# Patient Record
Sex: Female | Born: 1980 | Race: Black or African American | Hispanic: No | Marital: Married | Smoking: Never smoker
Health system: Southern US, Community
[De-identification: ages and names within clinical notes are randomized; demographics above are authoritative.]

## PROBLEM LIST (undated history)

## (undated) ENCOUNTER — Inpatient Hospital Stay (HOSPITAL_COMMUNITY): Payer: Self-pay

## (undated) DIAGNOSIS — E785 Hyperlipidemia, unspecified: Secondary | ICD-10-CM

## (undated) DIAGNOSIS — E119 Type 2 diabetes mellitus without complications: Secondary | ICD-10-CM

## (undated) DIAGNOSIS — T8859XA Other complications of anesthesia, initial encounter: Secondary | ICD-10-CM

## (undated) DIAGNOSIS — O24419 Gestational diabetes mellitus in pregnancy, unspecified control: Secondary | ICD-10-CM

## (undated) DIAGNOSIS — T4145XA Adverse effect of unspecified anesthetic, initial encounter: Secondary | ICD-10-CM

## (undated) DIAGNOSIS — Z789 Other specified health status: Secondary | ICD-10-CM

## (undated) HISTORY — DX: Gestational diabetes mellitus in pregnancy, unspecified control: O24.419

## (undated) HISTORY — DX: Type 2 diabetes mellitus without complications: E11.9

## (undated) HISTORY — DX: Hyperlipidemia, unspecified: E78.5

---

## 2008-11-30 ENCOUNTER — Inpatient Hospital Stay (HOSPITAL_COMMUNITY): Admission: AD | Admit: 2008-11-30 | Discharge: 2008-11-30 | Payer: Self-pay | Admitting: Obstetrics & Gynecology

## 2009-01-26 ENCOUNTER — Ambulatory Visit (HOSPITAL_COMMUNITY): Admission: RE | Admit: 2009-01-26 | Discharge: 2009-01-26 | Payer: Self-pay | Admitting: Obstetrics & Gynecology

## 2009-04-29 ENCOUNTER — Encounter: Admission: RE | Admit: 2009-04-29 | Discharge: 2009-04-29 | Payer: Self-pay | Admitting: Obstetrics & Gynecology

## 2009-04-29 ENCOUNTER — Ambulatory Visit (HOSPITAL_COMMUNITY): Admission: RE | Admit: 2009-04-29 | Discharge: 2009-04-29 | Payer: Self-pay | Admitting: Obstetrics & Gynecology

## 2009-05-28 ENCOUNTER — Ambulatory Visit (HOSPITAL_COMMUNITY): Admission: RE | Admit: 2009-05-28 | Discharge: 2009-05-28 | Payer: Self-pay | Admitting: Obstetrics & Gynecology

## 2009-06-14 ENCOUNTER — Encounter: Payer: Self-pay | Admitting: Obstetrics

## 2009-06-14 ENCOUNTER — Inpatient Hospital Stay (HOSPITAL_COMMUNITY): Admission: AD | Admit: 2009-06-14 | Discharge: 2009-06-17 | Payer: Self-pay | Admitting: Obstetrics

## 2009-07-18 ENCOUNTER — Ambulatory Visit: Payer: Self-pay | Admitting: Obstetrics and Gynecology

## 2009-07-18 ENCOUNTER — Inpatient Hospital Stay (HOSPITAL_COMMUNITY): Admission: AD | Admit: 2009-07-18 | Discharge: 2009-07-18 | Payer: Self-pay | Admitting: Obstetrics & Gynecology

## 2010-02-09 IMAGING — US US OB DETAIL+14 WK
1 series · 14 of 28 positions shown · non-contrast
Comparison: none

OBSTETRICAL ULTRASOUND:
 This ultrasound exam was performed in the [HOSPITAL] Ultrasound Department.  The OB US report was generated in the AS system, and faxed to the ordering physician.  This report is also available in [HOSPITAL]?s accessANYware and in [REDACTED] PACS.

[Series 1: us ob detail +14 wk · 14 of 71 slices shown]
[im 3/71]
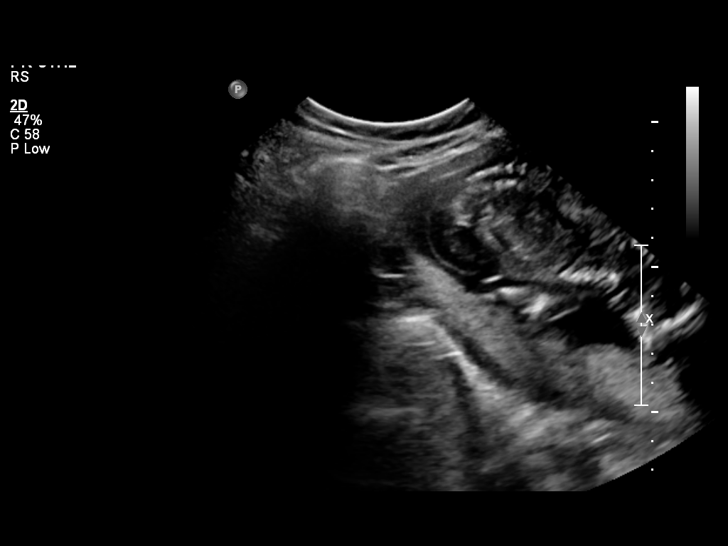
[im 8/71]
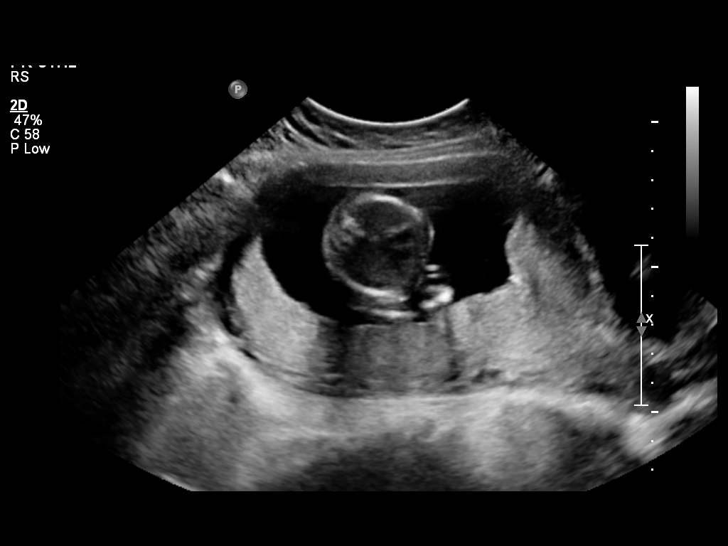
[im 13/71]
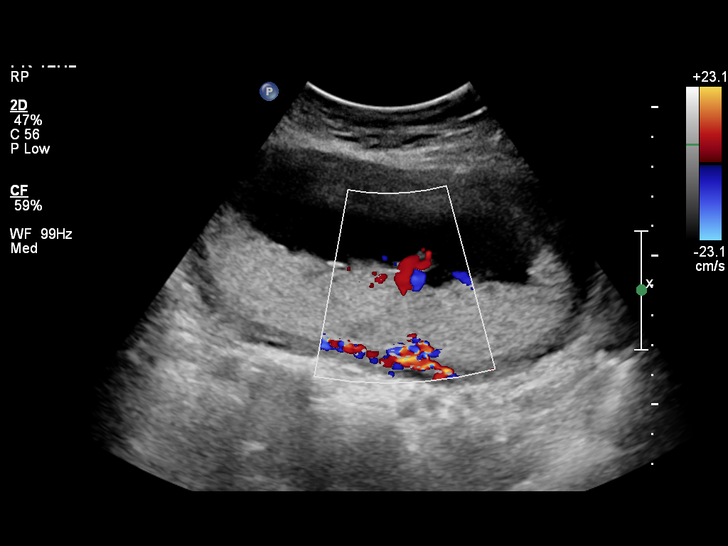
[im 19/71]
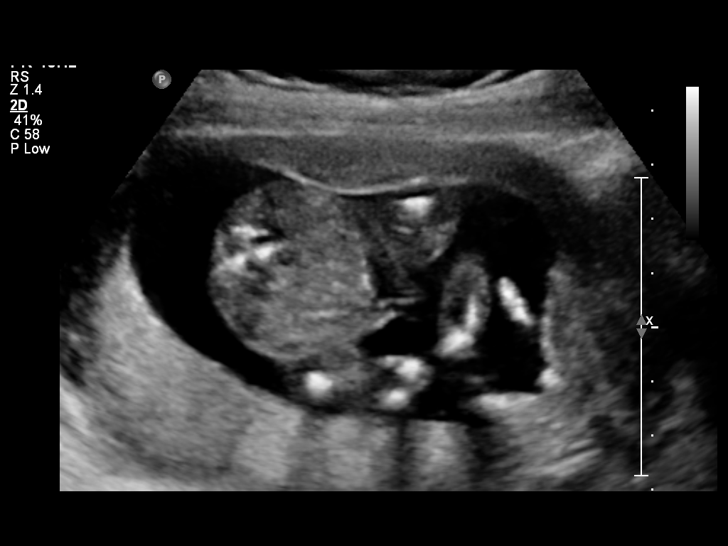
[im 24/71]
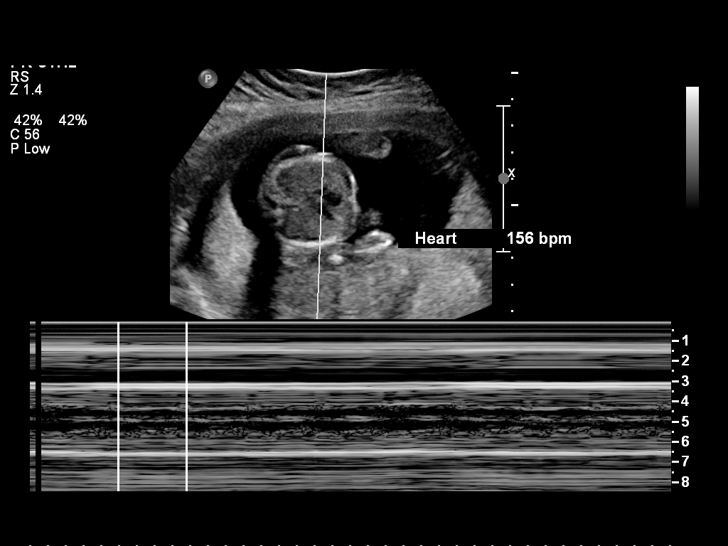
[im 29/71]
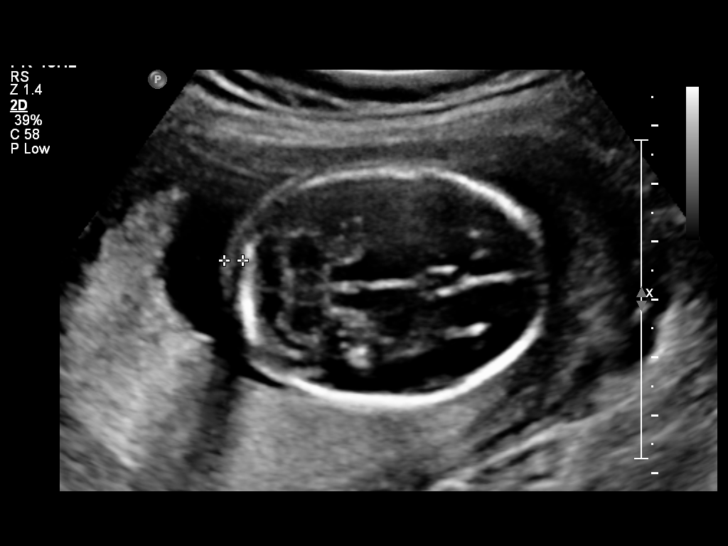
[im 34/71]
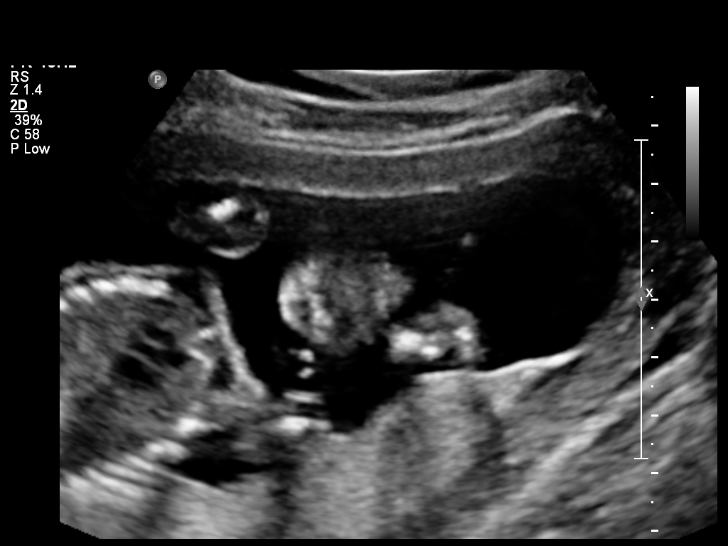
[im 39/71]
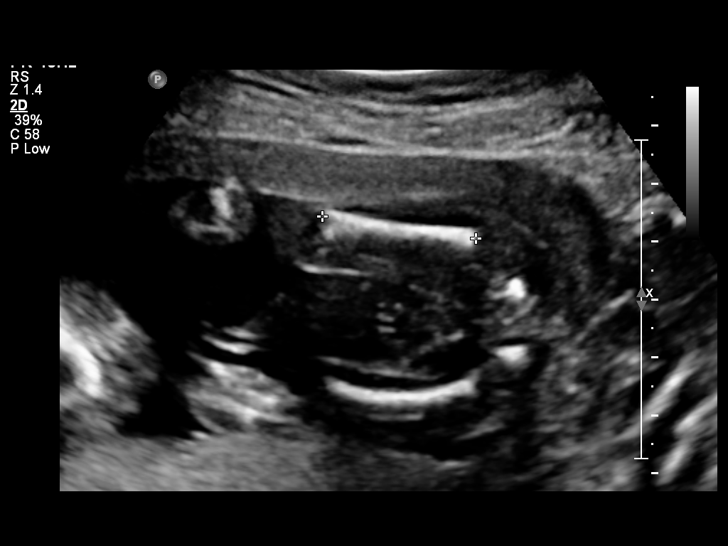
[im 45/71]
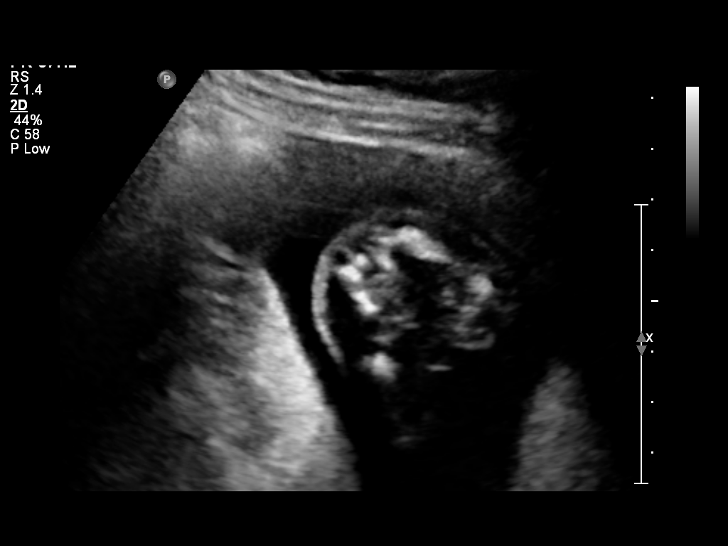
[im 50/71]
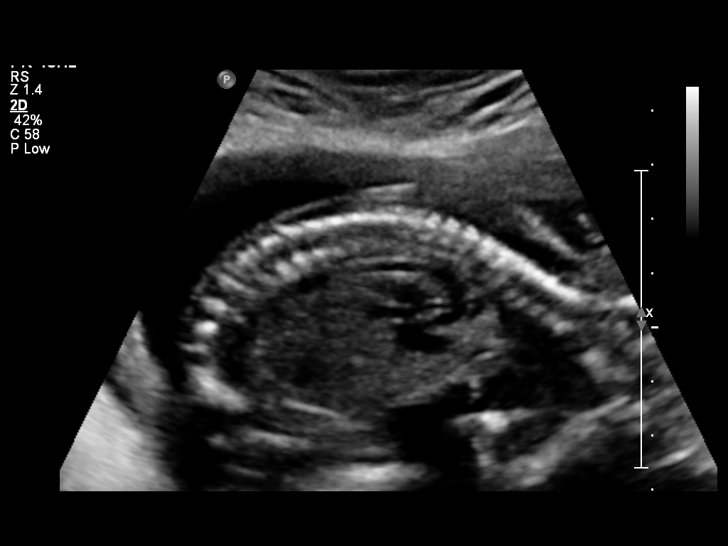
[im 55/71]
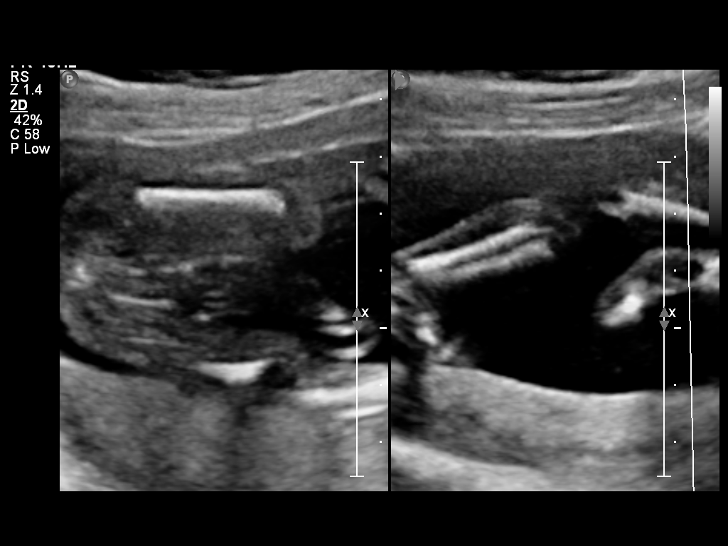
[im 60/71]
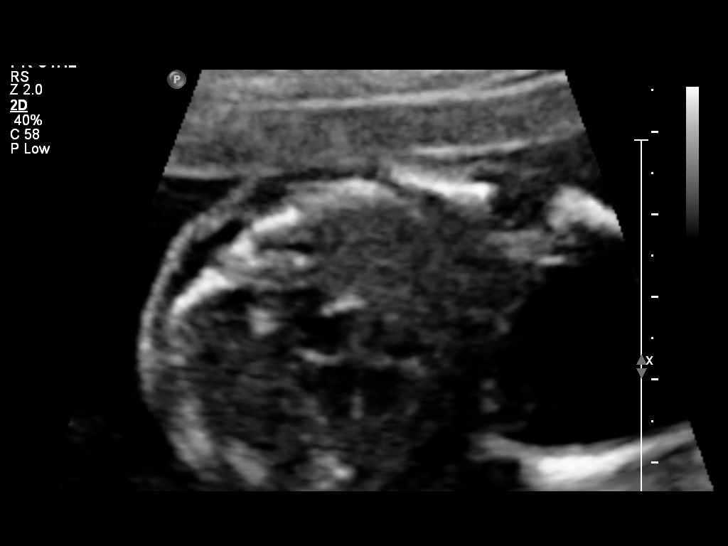
[im 65/71]
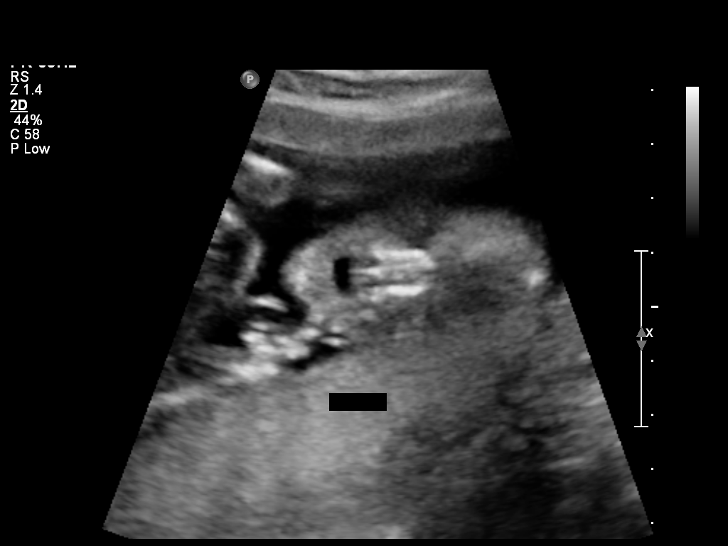
[im 71/71]
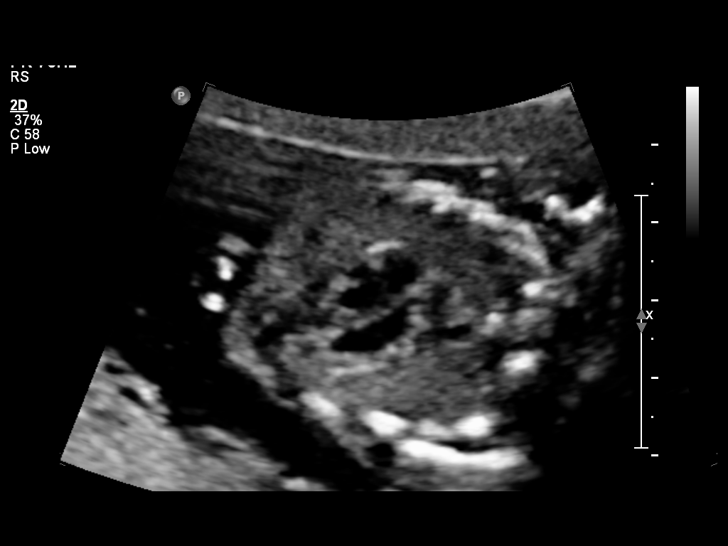

[14 of 28 positions shown; findings below may reference images not displayed]

IMPRESSION: See AS Obstetric US report.

## 2010-05-15 ENCOUNTER — Encounter: Payer: Self-pay | Admitting: Obstetrics & Gynecology

## 2010-07-13 LAB — CBC
Hemoglobin: 12.9 g/dL (ref 12.0–15.0)
MCHC: 33.9 g/dL (ref 30.0–36.0)
RBC: 4.35 MIL/uL (ref 3.87–5.11)
WBC: 7 10*3/uL (ref 4.0–10.5)

## 2010-07-13 LAB — GLUCOSE, CAPILLARY
Glucose-Capillary: 146 mg/dL — ABNORMAL HIGH (ref 70–99)
Glucose-Capillary: 85 mg/dL (ref 70–99)

## 2010-07-31 LAB — URINALYSIS, ROUTINE W REFLEX MICROSCOPIC
Bilirubin Urine: NEGATIVE
Glucose, UA: NEGATIVE mg/dL
Specific Gravity, Urine: 1.02 (ref 1.005–1.030)
pH: 7 (ref 5.0–8.0)

## 2010-07-31 LAB — URINE CULTURE: Culture: NO GROWTH

## 2010-07-31 LAB — WET PREP, GENITAL: Trich, Wet Prep: NONE SEEN

## 2010-07-31 LAB — GC/CHLAMYDIA PROBE AMP, URINE: Chlamydia, Swab/Urine, PCR: NEGATIVE

## 2013-03-14 ENCOUNTER — Encounter (HOSPITAL_COMMUNITY): Payer: Self-pay | Admitting: *Deleted

## 2013-03-14 ENCOUNTER — Inpatient Hospital Stay (HOSPITAL_COMMUNITY): Payer: No Typology Code available for payment source

## 2013-03-14 ENCOUNTER — Inpatient Hospital Stay (HOSPITAL_COMMUNITY)
Admission: AD | Admit: 2013-03-14 | Discharge: 2013-03-14 | Disposition: A | Discharge status: Home / Self Care | Payer: No Typology Code available for payment source | Source: Ambulatory Visit | Attending: Obstetrics & Gynecology | Admitting: Obstetrics & Gynecology

## 2013-03-14 DIAGNOSIS — O209 Hemorrhage in early pregnancy, unspecified: Secondary | ICD-10-CM | POA: Insufficient documentation

## 2013-03-14 DIAGNOSIS — R109 Unspecified abdominal pain: Secondary | ICD-10-CM | POA: Insufficient documentation

## 2013-03-14 HISTORY — DX: Other complications of anesthesia, initial encounter: T88.59XA

## 2013-03-14 HISTORY — DX: Other specified health status: Z78.9

## 2013-03-14 HISTORY — DX: Adverse effect of unspecified anesthetic, initial encounter: T41.45XA

## 2013-03-14 LAB — HCG, QUANTITATIVE, PREGNANCY: hCG, Beta Chain, Quant, S: 16875 m[IU]/mL — ABNORMAL HIGH (ref <5)

## 2013-03-14 LAB — URINALYSIS, ROUTINE W REFLEX MICROSCOPIC
Bilirubin Urine: NEGATIVE
Ketones, ur: NEGATIVE mg/dL
pH: 5 (ref 5.0–8.0)

## 2013-03-14 LAB — CBC
HCT: 38.1 % (ref 36.0–46.0)
MCV: 82.6 fL (ref 78.0–100.0)
RBC: 4.61 MIL/uL (ref 3.87–5.11)
WBC: 6.9 10*3/uL (ref 4.0–10.5)

## 2013-03-14 LAB — URINE MICROSCOPIC-ADD ON

## 2013-03-14 LAB — WET PREP, GENITAL
WBC, Wet Prep HPF POC: NONE SEEN
Yeast Wet Prep HPF POC: NONE SEEN

## 2013-03-14 LAB — POCT PREGNANCY, URINE: Preg Test, Ur: POSITIVE — AB

## 2013-03-14 NOTE — MAU Note (Signed)
Patient states she has had a positive pregnancy test at [facility]. Has had lower abdominal pain, Has had spotting that started yesterday and was brown, more today and red.

## 2013-03-14 NOTE — MAU Provider Note (Signed)
Chief Complaint: Possible Pregnancy   First Provider Initiated Contact with Patient 03/14/13 1911    Pt's husband used for interpretation (Arabic)  SUBJECTIVE HPI: Mariah Joyce is a 32 y.o. G3P2002 at [redacted]w[redacted]d by LMP who presents with 2d hx light vaginal spotting of red blood. Since arrival here, flow has gotten heavier. No clots or tissue. Also has mild intermittent suprapubic cramps. Denies dysuria, hematuria, urgency. Denies irritative vaginal discharge.   Past Medical History  Diagnosis Date  . Medical history non-contributory   . Complication of anesthesia    OB History  Gravida Para Term Preterm AB SAB TAB Ectopic Multiple Living  3 2 2       2     # Outcome Date GA Lbr Len/2nd Weight Sex Delivery Anes PTL Lv  3 CUR           2 TRM           1 TRM              Past Surgical History  Procedure Laterality Date  . Cesarean section    C/S x2  History   Social History  . Marital Status: Married    Spouse Name: N/A    Number of Children: N/A  . Years of Education: N/A   Occupational History  . Not on file.   Social History Main Topics  . Smoking status: Never Smoker   . Smokeless tobacco: Not on file  . Alcohol Use: No  . Drug Use: No  . Sexual Activity: Yes   Other Topics Concern  . Not on file   Social History Narrative  . No narrative on file   No current facility-administered medications on file prior to encounter.   No current outpatient prescriptions on file prior to encounter.   No Known Allergies  ROS: Pertinent items in HPI  OBJECTIVE Blood pressure 108/67, pulse 76, temperature 98.5 F (36.9 C), temperature source Oral, resp. rate 16, height 5\' 3"  (1.6 m), weight 70.58 kg (155 lb 9.6 oz), last menstrual period 01/09/2013, SpO2 100.00%. GENERAL: Well-developed, well-nourished female in no acute distress.  HEENT: Normocephalic HEART: normal rate RESP: normal effort ABDOMEN: Soft, minimally tender EXTREMITIES: Nontender, no  edema NEURO: Alert and oriented SPECULUM EXAM: femalecirc, moderate BRB with clots removed from vagina and cx. ; no tissue, cervix obscured by blood BIMANUAL: cervix ext 1/closed thick; uterus sl. enlarged, tender, no adnexal tenderness or masses  LAB RESULTS Results for orders placed during the hospital encounter of 03/14/13 (from the past 24 hour(s))  URINALYSIS, ROUTINE W REFLEX MICROSCOPIC     Status: Abnormal   Collection Time    03/14/13  6:30 PM      Result Value Range   Color, Urine RED (*) YELLOW   APPearance CLOUDY (*) CLEAR   Specific Gravity, Urine 1.015  1.005 - 1.030   pH 5.0  5.0 - 8.0   Glucose, UA NEGATIVE  NEGATIVE mg/dL   Hgb urine dipstick LARGE (*) NEGATIVE   Bilirubin Urine NEGATIVE  NEGATIVE   Ketones, ur NEGATIVE  NEGATIVE mg/dL   Protein, ur 295 (*) NEGATIVE mg/dL   Urobilinogen, UA 0.2  0.0 - 1.0 mg/dL   Nitrite POSITIVE (*) NEGATIVE   Leukocytes, UA NEGATIVE  NEGATIVE  URINE MICROSCOPIC-ADD ON     Status: Abnormal   Collection Time    03/14/13  6:30 PM      Result Value Range   Squamous Epithelial / LPF RARE  RARE  WBC, UA 0-2  <3 WBC/hpf   RBC / HPF TOO NUMEROUS TO COUNT  <3 RBC/hpf   Bacteria, UA MANY (*) RARE  POCT PREGNANCY, URINE     Status: Abnormal   Collection Time    03/14/13  6:48 PM      Result Value Range   Preg Test, Ur POSITIVE (*) NEGATIVE  WET PREP, GENITAL     Status: None   Collection Time    03/14/13  7:30 PM      Result Value Range   Yeast Wet Prep HPF POC NONE SEEN  NONE SEEN   Trich, Wet Prep NONE SEEN  NONE SEEN   Clue Cells Wet Prep HPF POC NONE SEEN  NONE SEEN   WBC, Wet Prep HPF POC NONE SEEN  NONE SEEN  CBC     Status: None   Collection Time    03/14/13  7:44 PM      Result Value Range   WBC 6.9  4.0 - 10.5 K/uL   RBC 4.61  3.87 - 5.11 MIL/uL   Hemoglobin 13.2  12.0 - 15.0 g/dL   HCT 57.8  46.9 - 62.9 %   MCV 82.6  78.0 - 100.0 fL   MCH 28.6  26.0 - 34.0 pg   MCHC 34.6  30.0 - 36.0 g/dL   RDW 52.8  41.3 -  24.4 %   Platelets 199  150 - 400 K/uL  HCG, QUANTITATIVE, PREGNANCY     Status: Abnormal   Collection Time    03/14/13  7:44 PM      Result Value Range   hCG, Beta Chain, Quant, S 16875 (*) <5 mIU/mL    IMAGING CLINICAL DATA: Vaginal bleeding.  EXAM:  TRANSVAGINAL OB ULTRASOUND; OBSTETRIC <14 WK ULTRASOUND  TECHNIQUE:  Transvaginal ultrasound was performed for complete evaluation of the  gestation as well as the maternal uterus, adnexal regions, and  pelvic cul-de-sac.  COMPARISON: None.  FINDINGS:  Intrauterine gestational sac: Visualized within the lower uterine  segment region of the uterus.  Yolk sac: Question early yolk sac  Embryo: Not visualize  Cardiac Activity: Not visualized  Heart Rate: bpm  MSD: 16 mm mm 6 w 4 d  [place] EDC: 11/03/2013  Maternal uterus/adnexae: Ovaries are symmetric and unremarkable. No  free fluid.  IMPRESSION:  Early gestational sac within the lower uterine segment region. Given  its location, cannot exclude spontaneous abortion in progress. No  fetal pole visualized. Recommend clinical follow-up and followup  ultrasound in 10-14 days.  Electronically Signed  By: Charlett Nose M.D.  On: 03/14/2013 20:36   MAU COURSE: Counseled couple on likelihood of miscarriage. Pregnancy is desired.  Continued to have small to mod amount of bleeding and mild cramping after return from [place].   ASSESSMENT 1. Bleeding in early pregnancy   Likely SAB in progress  PLAN Discharge home with bleeding precautions and ectopic precautions   Follow-up Information   Follow up with Nursepractioner Mau, NP In 2 days. (repeat quant beta hCG)    Contact information:   [phone]        Danae Orleans, CNM 03/14/2013  7:13 PM

## 2013-03-14 NOTE — MAU Provider Note (Signed)
Attestation of Attending Supervision of Advanced Practitioner (CNM/NP): Evaluation and management procedures were performed by the Advanced Practitioner under my supervision and collaboration.  I have reviewed the Advanced Practitioner's note and chart, and I agree with the management and plan.  HARRAWAY-SMITH, Charlesetta Milliron 10:00 PM     

## 2013-03-15 LAB — GC/CHLAMYDIA PROBE AMP: GC Probe RNA: NEGATIVE

## 2013-03-16 ENCOUNTER — Inpatient Hospital Stay (HOSPITAL_COMMUNITY)
Admission: AD | Admit: 2013-03-16 | Discharge: 2013-03-16 | Disposition: A | Discharge status: Home / Self Care | Payer: No Typology Code available for payment source | Source: Ambulatory Visit | Attending: Obstetrics & Gynecology | Admitting: Obstetrics & Gynecology

## 2013-03-16 DIAGNOSIS — O039 Complete or unspecified spontaneous abortion without complication: Secondary | ICD-10-CM | POA: Diagnosis present

## 2013-03-16 LAB — HCG, QUANTITATIVE, PREGNANCY: hCG, Beta Chain, Quant, S: 3403 m[IU]/mL — ABNORMAL HIGH (ref <5)

## 2013-03-16 NOTE — MAU Note (Signed)
Pt/spouse state here for repeat BHCG. Was here for bleeding. Bleeding much decreased, wearing pad/pantyliner and changing twice daily. Mild pain

## 2013-03-16 NOTE — MAU Provider Note (Signed)
HPI:  Ms. Kaitelyn Jamison is a 32 y.o. female G3P2002 at [redacted]w[redacted]d who presents for a follow up beta hcg.  She was here on 11/21 and was told that she was likely experiencing a miscarriage based on the amount of vaginal bleeding she was having. In MAU on the 11/21 she passed a large amount of tissue and the specimen was sent to pathology for review.  Currently she is having light vaginal bleeding and no pain.  Beta Hcg 11/21: 16875, Beta Hcg 11/23: 3403   Objective:  GENERAL: Well-developed, well-nourished female in no acute distress.  HEENT: Normocephalic, atraumatic.   SKIN: Warm, dry and without erythema PSYCH: Normal mood and affect  Filed Vitals:   03/16/13 1421  BP: 97/62  Temp: 97.8 F (36.6 C)  TempSrc: Oral  Resp: 18  Height: 5\' 2"  (1.575 m)  Weight: 70.364 kg (155 lb 2 oz)    Results for orders placed during the hospital encounter of 03/16/13 (from the past 24 hour(s))  HCG, QUANTITATIVE, PREGNANCY     Status: Abnormal   Collection Time    03/16/13  2:08 PM      Result Value Range   hCG, Beta Chain, Quant, S 3403 (*) <5 mIU/mL    A: SAB  P: Discharge home Follow up in the clinic; the clinic will call you to schedule an appointment  Bleeding precautions discussed Return to MAU as needed, if pain or bleeding increase  Support given  Iona Hansen Germani Gavilanes, NP 03/16/2013 3:12 PM

## 2013-03-17 NOTE — MAU Provider Note (Signed)
Attestation of Attending Supervision of Advanced Practitioner (CNM/NP): Evaluation and management procedures were performed by the Advanced Practitioner under my supervision and collaboration. I have reviewed the Advanced Practitioner's note and chart, and I agree with the management and plan.  LEGGETT,KELLY H. 6:20 AM   

## 2013-04-07 ENCOUNTER — Ambulatory Visit (INDEPENDENT_AMBULATORY_CARE_PROVIDER_SITE_OTHER): Payer: No Typology Code available for payment source | Admitting: Nurse Practitioner

## 2013-04-07 ENCOUNTER — Encounter: Payer: Self-pay | Admitting: Nurse Practitioner

## 2013-04-07 VITALS — BP 104/79 | HR 75 | Temp 97.1°F | Ht 62.0 in | Wt 152.1 lb

## 2013-04-07 DIAGNOSIS — O039 Complete or unspecified spontaneous abortion without complication: Secondary | ICD-10-CM

## 2013-04-07 DIAGNOSIS — N390 Urinary tract infection, site not specified: Secondary | ICD-10-CM

## 2013-04-07 LAB — POCT URINALYSIS DIP (DEVICE)
Bilirubin Urine: NEGATIVE
Leukocytes, UA: NEGATIVE
Nitrite: NEGATIVE
Protein, ur: NEGATIVE mg/dL
Urobilinogen, UA: 0.2 mg/dL (ref 0.0–1.0)
pH: 7 (ref 5.0–8.0)

## 2013-04-07 MED ORDER — PRENATAL VITAMINS 0.8 MG PO TABS: 1.0000 | ORAL_TABLET | Freq: Every day | ORAL | Status: DC

## 2013-04-07 NOTE — Progress Notes (Signed)
History:  Mariah Joyce is a 32 y.o. A5W0981 who presents to [facility] clinic today for follow up on SAB. She had miscarriage on 03/14/13 her BHCG was 16,875. On 11/23 it was 3,403. In MAU at time of miscarriage she also had a UTI that was not treated and husband is concerned about that. She denies any pain or bleeding. They have resumed intercourse. She denies any sx of UTI now. Language line is used for Aerobic   The following portions of the patient's history were reviewed and updated as appropriate: allergies, current medications, past family history, past medical history, past social history, past surgical history and problem list.  Review of Systems:  Pertinent items are noted in HPI.  Objective:  Physical Exam BP 104/79  Pulse 75  Temp(Src) 97.1 F (36.2 C) (Oral)  Ht 5\' 2"  (1.575 m)  Wt 152 lb 1.6 oz (68.992 kg)  BMI 27.81 kg/m2  LMP 01/09/2013  Breastfeeding? Unknown GENERAL: Well-developed, well-nourished female in no acute distress.  Skin: warm and dry   Labs and Imaging US Ob Comp Less 14 Wks  03/14/2013   CLINICAL DATA:  Vaginal bleeding.  EXAM: TRANSVAGINAL OB ULTRASOUND; OBSTETRIC <14 WK ULTRASOUND  TECHNIQUE: Transvaginal ultrasound was performed for complete evaluation of the gestation as well as the maternal uterus, adnexal regions, and pelvic cul-de-sac.  COMPARISON:  None.  FINDINGS: Intrauterine gestational sac: Visualized within the lower uterine segment region of the uterus.  Yolk sac:  Question early yolk sac  Embryo:  Not visualize  Cardiac Activity: Not visualized  Heart Rate:  bpm  MSD: 16 mm  mm   6 w   4  d  [place] EDC: 11/03/2013  Maternal uterus/adnexae: Ovaries are symmetric and unremarkable. No free fluid.  IMPRESSION: Early gestational sac within the lower uterine segment region. Given its location, cannot exclude spontaneous abortion in progress. No fetal pole visualized. Recommend clinical follow-up and followup ultrasound in 10-14 days.    Electronically Signed   By: Charlett Nose M.D.   On: 03/14/2013 20:36   US Ob Transvaginal  03/14/2013   CLINICAL DATA:  Vaginal bleeding.  EXAM: TRANSVAGINAL OB ULTRASOUND; OBSTETRIC <14 WK ULTRASOUND  TECHNIQUE: Transvaginal ultrasound was performed for complete evaluation of the gestation as well as the maternal uterus, adnexal regions, and pelvic cul-de-sac.  COMPARISON:  None.  FINDINGS: Intrauterine gestational sac: Visualized within the lower uterine segment region of the uterus.  Yolk sac:  Question early yolk sac  Embryo:  Not visualize  Cardiac Activity: Not visualized  Heart Rate:  bpm  MSD: 16 mm  mm   6 w   4  d  [place] EDC: 11/03/2013  Maternal uterus/adnexae: Ovaries are symmetric and unremarkable. No free fluid.  IMPRESSION: Early gestational sac within the lower uterine segment region. Given its location, cannot exclude spontaneous abortion in progress. No fetal pole visualized. Recommend clinical follow-up and followup ultrasound in 10-14 days.   Electronically Signed   By: Charlett Nose M.D.   On: 03/14/2013 20:36   Results for orders placed in visit on 04/07/13 (from the past 24 hour(s))  POCT URINALYSIS DIP (DEVICE)     Status: Abnormal   Collection Time    04/07/13  2:29 PM      Result Value Range   Glucose, UA NEGATIVE  NEGATIVE mg/dL   Bilirubin Urine NEGATIVE  NEGATIVE   Ketones, ur NEGATIVE  NEGATIVE mg/dL   Specific Gravity, Urine 1.020  1.005 - 1.030   Hgb urine  dipstick SMALL (*) NEGATIVE   pH 7.0  5.0 - 8.0   Protein, ur NEGATIVE  NEGATIVE mg/dL   Urobilinogen, UA 0.2  0.0 - 1.0 mg/dL   Nitrite NEGATIVE  NEGATIVE   Leukocytes, UA NEGATIVE  NEGATIVE    Assessment & Plan:  Assessment:  SAB ? UTI  Plans:  Will repeat BHCG and urine culture today Will advise of results Pt requested PNV prescription Advised to not become pregnant for 3-6 cycles/ they will use condoms  Delbert Phenix, NP 04/07/2013 4:59 PM

## 2013-04-07 NOTE — Patient Instructions (Signed)
Miscarriage  A miscarriage is the loss of an unborn baby (fetus) before the 20th week of pregnancy. The cause is often unknown.   HOME CARE  · You may need to stay in bed (bed rest), or you may be able to do light activity. Go about activity as told by your doctor.  · Have help at home.  · Write down how many pads you use each day. Write down how soaked they are.  · Do not use tampons. Do not wash out your vagina (douche) or have sex (intercourse) until your doctor approves.  · Only take medicine as told by your doctor.  · Do not take aspirin.  · Keep all doctor visits as told.  · If you or your partner have problems with grieving, talk to your doctor. You can also try counseling. Give yourself time to grieve before trying to get pregnant again.  GET HELP RIGHT AWAY IF:  · You have bad cramps or pain in your back or belly (abdomen).  · You have a fever.  · You pass large clumps of blood (clots) from your vagina that are walnut-sized or larger. Save the clumps for your doctor to see.  · You pass large amounts of tissue from your vagina. Save the tissue for your doctor to see.  · You have more bleeding.  · You have thick, bad-smelling fluid (discharge) coming from the vagina.  · You get lightheaded, weak, or you pass out (faint).  · You have chills.  MAKE SURE YOU:  · Understand these instructions.  · Will watch your condition.  · Will get help right away if you are not doing well or get worse.  Document Released: 07/03/2011 Document Reviewed: 07/03/2011  ExitCare® Patient Information ©2014 [employer].

## 2013-04-09 LAB — URINE CULTURE: Colony Count: NO GROWTH

## 2013-04-24 NOTE — L&D Delivery Note (Signed)
Assumed care of pt. Went to bedside for evaluation after notable change on FHRT to Cat 2 for intermittent late decels; moderate variability noted throughout. Pitocin discontinued. Pt. found to be C/C/+2 w/ tight introitus from previous circumcision procedure.  Decision made to cut mediolateral episiotomy to increase outlet, but delivery was unsuccessful with this cut only. In-house faculty (Dr. Macon LargeAnyanwu) and Dr. Normand Sloopillard [facility] of [facility] possible fetal dyst[facility]. Dr. Macon LargeAnyanwu at bedside to assist w/ cutting in area of previous scar and remaining at bedside until Dr. Redmond Basemanillard's arrival.  Neonatologist also in attendance due to light mec noted after AROM and above mentioned late decels.  Delivery Note At 9:17 PM a viable female "Moataman" was delivered via VBAC, Spontaneous (Presentation: Occiput Anterior).  APGARS: 9, 9; weight 6 lb 14.8 oz (3140 g).   Placenta status: Intact, Spontaneous.  Cord: 3 vessels with the following complications: None.  Cord pH: NA.  Anesthesia: Epidural and lidocaine for mediolateral episiotomy and previous female circumcision scar  Episiotomy: Right mediolateral Lacerations: None  Suture Repair: 3.0 vicryl CT-1 and SH (repaired female circumcision and mediolateral episiotomy with the assistance of Dr. Macon LargeAnyanwu); no additional repairs required. Est. Blood Loss (mL): 300  Mom to postpartum.  Baby to Couplet care / Skin to Skin.  Couple plans inpatient circumcision.  Mom plans to breastfeed.  Pt is undecided re: birth control.  Sherre ScarletWILLIAMS, Earlisha Sharples 03/12/2014, 10:18 PM

## 2013-05-02 ENCOUNTER — Encounter: Payer: No Typology Code available for payment source | Admitting: Nurse Practitioner

## 2013-07-18 ENCOUNTER — Inpatient Hospital Stay (HOSPITAL_COMMUNITY): Payer: Medicaid Other

## 2013-07-18 ENCOUNTER — Inpatient Hospital Stay (HOSPITAL_COMMUNITY)
Admission: AD | Admit: 2013-07-18 | Discharge: 2013-07-18 | Disposition: A | Discharge status: Home / Self Care | Payer: Medicaid Other | Source: Ambulatory Visit | Attending: Obstetrics & Gynecology | Admitting: Obstetrics & Gynecology

## 2013-07-18 ENCOUNTER — Encounter (HOSPITAL_COMMUNITY): Payer: Self-pay | Admitting: *Deleted

## 2013-07-18 DIAGNOSIS — O9989 Other specified diseases and conditions complicating pregnancy, childbirth and the puerperium: Principal | ICD-10-CM

## 2013-07-18 DIAGNOSIS — O99891 Other specified diseases and conditions complicating pregnancy: Secondary | ICD-10-CM | POA: Insufficient documentation

## 2013-07-18 DIAGNOSIS — O26899 Other specified pregnancy related conditions, unspecified trimester: Secondary | ICD-10-CM

## 2013-07-18 DIAGNOSIS — R109 Unspecified abdominal pain: Secondary | ICD-10-CM

## 2013-07-18 LAB — URINE MICROSCOPIC-ADD ON

## 2013-07-18 LAB — CBC
HCT: 37.5 % (ref 36.0–46.0)
Hemoglobin: 13.2 g/dL (ref 12.0–15.0)
MCH: 29.8 pg (ref 26.0–34.0)
MCHC: 35.2 g/dL (ref 30.0–36.0)
MCV: 84.7 fL (ref 78.0–100.0)
PLATELETS: 183 10*3/uL (ref 150–400)
RBC: 4.43 MIL/uL (ref 3.87–5.11)
RDW: 13.3 % (ref 11.5–15.5)
WBC: 6 10*3/uL (ref 4.0–10.5)

## 2013-07-18 LAB — URINALYSIS, ROUTINE W REFLEX MICROSCOPIC
Bilirubin Urine: NEGATIVE
Glucose, UA: NEGATIVE mg/dL
Ketones, ur: NEGATIVE mg/dL
LEUKOCYTES UA: NEGATIVE
NITRITE: NEGATIVE
PH: 6 (ref 5.0–8.0)
Protein, ur: NEGATIVE mg/dL
Specific Gravity, Urine: 1.02 (ref 1.005–1.030)
Urobilinogen, UA: 0.2 mg/dL (ref 0.0–1.0)

## 2013-07-18 LAB — HCG, QUANTITATIVE, PREGNANCY: hCG, Beta Chain, Quant, S: 4930 m[IU]/mL — ABNORMAL HIGH (ref <5)

## 2013-07-18 LAB — POCT PREGNANCY, URINE: Preg Test, Ur: POSITIVE — AB

## 2013-07-18 MED ORDER — PRENATAL PLUS 27-1 MG PO TABS: 1.0000 | ORAL_TABLET | Freq: Every day | ORAL | Status: DC

## 2013-07-18 NOTE — Discharge Instructions (Signed)
Abdominal Pain During Pregnancy °Abdominal pain is common in pregnancy. Most of the time, it does not cause harm. There are many causes of abdominal pain. Some causes are more serious than others. Some of the causes of abdominal pain in pregnancy are easily diagnosed. Occasionally, the diagnosis takes time to understand. Other times, the cause is not determined. Abdominal pain can be a sign that something is very wrong with the pregnancy, or the pain may have nothing to do with the pregnancy at all. For this reason, always tell your health care provider if you have any abdominal discomfort. °HOME CARE INSTRUCTIONS  °Monitor your abdominal pain for any changes. The following actions may help to alleviate any discomfort you are experiencing: °· Do not have sexual intercourse or put anything in your vagina until your symptoms go away completely. °· Get plenty of rest until your pain improves. °· Drink clear fluids if you feel nauseous. Avoid solid food as long as you are uncomfortable or nauseous. °· Only take over-the-counter or prescription medicine as directed by your health care provider. °· Keep all follow-up appointments with your health care provider. °SEEK IMMEDIATE MEDICAL CARE IF: °· You are bleeding, leaking fluid, or passing tissue from the vagina. °· You have increasing pain or cramping. °· You have persistent vomiting. °· You have painful or bloody urination. °· You have a fever. °· You notice a decrease in your baby's movements. °· You have extreme weakness or feel faint. °· You have shortness of breath, with or without abdominal pain. °· You develop a severe headache with abdominal pain. °· You have abnormal vaginal discharge with abdominal pain. °· You have persistent diarrhea. °· You have abdominal pain that continues even after rest, or gets worse. °MAKE SURE YOU:  °· Understand these instructions. °· Will watch your condition. °· Will get help right away if you are not doing well or get  worse. °Document Released: 04/10/2005 Document Revised: 01/29/2013 Document Reviewed: 11/07/2012 °ExitCare® Patient Information ©2014 [employer]. ° °

## 2013-07-18 NOTE — MAU Note (Signed)
Pt presents with complaints of pain in the lower part of her abdomen for a couple of days. States concern since she had a miscarriage at 9 weeks.

## 2013-07-18 NOTE — MAU Provider Note (Signed)
Chief Complaint: Abdominal Pain      SUBJECTIVE HPI: Mariah Joyce is a 33 y.o. [id number] at 8259w2d by LMP who presents with suprapubic crampy pain for 2 days. Feels pressure continuously and sharp pain intermittently. No vaginal bleeding. Had neg WP, GC and chlamydia 03/14/13 and declines repeat testing. Pregnancy is desired. Had SAB at 9wks 02/2013.  Past Medical History  Diagnosis Date  . Medical history non-contributory   . Complication of anesthesia    OB History  Gravida Para Term Preterm AB SAB TAB Ectopic Multiple Living  4 2 2  0 1 1 0 0 0 2    # Outcome Date GA Lbr Len/2nd Weight Sex Delivery Anes PTL Lv  4 CUR           3 SAB           2 TRM           1 TRM              Past Surgical History  Procedure Laterality Date  . Cesarean section     History   Social History  . Marital Status: Married    Spouse Name: N/A    Number of Children: N/A  . Years of Education: N/A   Occupational History  . Not on file.   Social History Main Topics  . Smoking status: Never Smoker   . Smokeless tobacco: Not on file  . Alcohol Use: No  . Drug Use: No  . Sexual Activity: Yes   Other Topics Concern  . Not on file   Social History Narrative  . No narrative on file   No current facility-administered medications on file prior to encounter.   Current Outpatient Prescriptions on File Prior to Encounter  Medication Sig Dispense Refill  . Prenatal Multivit-Min-Fe-FA (PRENATAL VITAMINS) 0.8 MG tablet Take 1 tablet by mouth daily.  30 tablet  12   No Known Allergies  ROS: Pertinent items in HPI  OBJECTIVE Blood pressure 105/71, pulse 78, temperature 98.2 F (36.8 C), temperature source Oral, resp. rate 18, last menstrual period 06/11/2012, SpO2 100.00%, unknown if currently breastfeeding. GENERAL: Well-developed, well-nourished female in no acute distress.  HEENT: Normocephalic HEART: normal rate RESP: normal effort ABDOMEN: Soft, non-tender EXTREMITIES:  Nontender, no edema NEURO: Alert and oriented BIMANUAL: cervix L/ C; uterus 4-6 wk size, no adnexal tenderness or masses  LAB RESULTS Results for orders placed during the hospital encounter of 07/18/13 (from the past 24 hour(s))  URINALYSIS, ROUTINE W REFLEX MICROSCOPIC     Status: Abnormal   Collection Time    07/18/13  4:55 PM      Result Value Ref Range   Color, Urine YELLOW  YELLOW   APPearance CLEAR  CLEAR   Specific Gravity, Urine 1.020  1.005 - 1.030   pH 6.0  5.0 - 8.0   Glucose, UA NEGATIVE  NEGATIVE mg/dL   Hgb urine dipstick MODERATE (*) NEGATIVE   Bilirubin Urine NEGATIVE  NEGATIVE   Ketones, ur NEGATIVE  NEGATIVE mg/dL   Protein, ur NEGATIVE  NEGATIVE mg/dL   Urobilinogen, UA 0.2  0.0 - 1.0 mg/dL   Nitrite NEGATIVE  NEGATIVE   Leukocytes, UA NEGATIVE  NEGATIVE  URINE MICROSCOPIC-ADD ON     Status: Abnormal   Collection Time    07/18/13  4:55 PM      Result Value Ref Range   Squamous Epithelial / LPF MANY (*) RARE   WBC, UA 3-6  <3 WBC/hpf  RBC / HPF 3-6  <3 RBC/hpf   Bacteria, UA MANY (*) RARE  POCT PREGNANCY, URINE     Status: Abnormal   Collection Time    07/18/13  5:18 PM      Result Value Ref Range   Preg Test, Ur POSITIVE (*) NEGATIVE  HCG, QUANTITATIVE, PREGNANCY     Status: Abnormal   Collection Time    07/18/13  5:24 PM      Result Value Ref Range   hCG, Beta Chain, Quant, S 4930 (*) <5 mIU/mL  CBC     Status: None   Collection Time    07/18/13  5:25 PM      Result Value Ref Range   WBC 6.0  4.0 - 10.5 K/uL   RBC 4.43  3.87 - 5.11 MIL/uL   Hemoglobin 13.2  12.0 - 15.0 g/dL   HCT 40.9  81.1 - 91.4 %   MCV 84.7  78.0 - 100.0 fL   MCH 29.8  26.0 - 34.0 pg   MCHC 35.2  30.0 - 36.0 g/dL   RDW 78.2  95.6 - 21.3 %   Platelets 183  150 - 400 K/uL    IMAGING IUGS c/w [redacted]w[redacted]d, no YS or embyo; small CLC  MAU COURSE  ASSESSMENT 1. Abdominal pain complicating pregnancy   G4P2012 at [redacted]w[redacted]d by LMP and [place], viability not determined    PLAN Discharge  home with pain and bleeding precautions. Return in 48 hrs for F/U quantitative beta hCG    Medication List         prenatal vitamin w/FE, FA 27-1 MG Tabs tablet  Take 1 tablet by mouth daily.       Follow-up Information   Follow up with Nursepractioner Mau, NP On 07/20/2013.   Contact information:   [phone]       Danae Orleans, CNM 07/18/2013  5:21 PM

## 2013-07-21 ENCOUNTER — Telehealth: Payer: Self-pay | Admitting: General Practice

## 2013-07-21 ENCOUNTER — Other Ambulatory Visit: Payer: Medicaid Other

## 2013-07-21 DIAGNOSIS — E349 Endocrine disorder, unspecified: Secondary | ICD-10-CM

## 2013-07-21 LAB — HCG, QUANTITATIVE, PREGNANCY: hCG, Beta Chain, Quant, S: 13653.6 m[IU]/mL

## 2013-07-21 NOTE — Telephone Encounter (Signed)
Allison from [facility] called with stat bchg results of [id number]. Discussed results with Dr Erin Fullingharraway smith who reviewed ultrasound as well. Dr Erin Fullingharraway smith stated her hcg is going up appropriately and patient may start prenatal care. Called patient with [facility] [facility] [phone], no answer phone just continued to ring.

## 2013-07-23 NOTE — Telephone Encounter (Signed)
Called pt with [employer] interpreter # [phone] and left message informing pt to please call the clinics to schedule a prenatal appt at her earliest convenience.  Sent letter.

## 2013-08-01 LAB — OB RESULTS CONSOLE GC/CHLAMYDIA
Chlamydia: NEGATIVE
Gonorrhea: NEGATIVE

## 2013-08-01 LAB — OB RESULTS CONSOLE ABO/RH: RH Type: POSITIVE

## 2013-08-01 LAB — OB RESULTS CONSOLE ANTIBODY SCREEN: Antibody Screen: NEGATIVE

## 2013-08-01 LAB — OB RESULTS CONSOLE RPR: RPR: NONREACTIVE

## 2013-08-01 LAB — OB RESULTS CONSOLE HEPATITIS B SURFACE ANTIGEN: Hepatitis B Surface Ag: NEGATIVE

## 2013-08-01 LAB — OB RESULTS CONSOLE RUBELLA ANTIBODY, IGM: RUBELLA: IMMUNE

## 2013-08-01 LAB — OB RESULTS CONSOLE HIV ANTIBODY (ROUTINE TESTING): HIV: NONREACTIVE

## 2013-09-08 ENCOUNTER — Encounter: Payer: Self-pay | Admitting: General Practice

## 2013-09-19 ENCOUNTER — Encounter: Payer: Self-pay | Admitting: General Practice

## 2014-01-29 ENCOUNTER — Encounter: Payer: Medicaid Other | Attending: Obstetrics & Gynecology | Admitting: *Deleted

## 2014-01-29 ENCOUNTER — Encounter: Payer: Self-pay | Admitting: *Deleted

## 2014-01-29 VITALS — Ht 63.0 in | Wt 167.2 lb

## 2014-01-29 DIAGNOSIS — R7302 Impaired glucose tolerance (oral): Secondary | ICD-10-CM | POA: Insufficient documentation

## 2014-01-29 DIAGNOSIS — R7309 Other abnormal glucose: Secondary | ICD-10-CM

## 2014-01-29 DIAGNOSIS — Z713 Dietary counseling and surveillance: Secondary | ICD-10-CM | POA: Insufficient documentation

## 2014-01-29 NOTE — Progress Notes (Signed)
  Patient was seen on 01/29/2014 for Gestational Diabetes self-management individual appointment due to language barrier, interpretor here, at the [facility]. The following learning objectives were met by the patient during this visit:   States the definition of Gestational Diabetes  States why dietary management is important in controlling blood glucose  Describes the effects each nutrient has on blood glucose levels  Demonstrates ability to create a balanced meal plan  Demonstrates carbohydrate counting   States when to check blood glucose levels  Demonstrates proper blood glucose monitoring techniques  States the effect of stress and exercise on blood glucose levels  States the importance of limiting caffeine and abstaining from alcohol and smoking  Blood glucose monitor given: Accu Chek Nano BG Monitoring Kit Lot # [id number] Exp: 12/23/2014 Blood glucose reading: 89 mg/dl  Patient instructed to monitor glucose levels: FBS: 60 - <90 2 hour: <120  *Patient received handouts:  Nutrition Diabetes and Pregnancy  Carbohydrate Counting List  Arabic information on Insulin Resistance  Patient will be seen for follow-up as needed.

## 2014-02-18 ENCOUNTER — Encounter: Payer: Self-pay | Admitting: *Deleted

## 2014-02-23 ENCOUNTER — Encounter: Payer: Self-pay | Admitting: *Deleted

## 2014-03-10 ENCOUNTER — Telehealth (HOSPITAL_COMMUNITY): Payer: Self-pay | Admitting: *Deleted

## 2014-03-10 ENCOUNTER — Encounter (HOSPITAL_COMMUNITY): Payer: Self-pay | Admitting: *Deleted

## 2014-03-10 DIAGNOSIS — Z789 Other specified health status: Secondary | ICD-10-CM | POA: Insufficient documentation

## 2014-03-10 DIAGNOSIS — O34219 Maternal care for unspecified type scar from previous cesarean delivery: Secondary | ICD-10-CM | POA: Insufficient documentation

## 2014-03-10 DIAGNOSIS — B951 Streptococcus, group B, as the cause of diseases classified elsewhere: Secondary | ICD-10-CM | POA: Insufficient documentation

## 2014-03-10 DIAGNOSIS — E669 Obesity, unspecified: Secondary | ICD-10-CM | POA: Insufficient documentation

## 2014-03-10 DIAGNOSIS — Z8632 Personal history of gestational diabetes: Secondary | ICD-10-CM | POA: Insufficient documentation

## 2014-03-10 DIAGNOSIS — O24419 Gestational diabetes mellitus in pregnancy, unspecified control: Secondary | ICD-10-CM | POA: Insufficient documentation

## 2014-03-10 DIAGNOSIS — IMO0001 Reserved for inherently not codable concepts without codable children: Secondary | ICD-10-CM | POA: Insufficient documentation

## 2014-03-10 HISTORY — DX: Obesity, unspecified: E66.9

## 2014-03-10 LAB — OB RESULTS CONSOLE GBS: GBS: POSITIVE

## 2014-03-10 NOTE — Telephone Encounter (Signed)
Preadmission screen  

## 2014-03-10 NOTE — H&P (Signed)
Mariah Joyce is a 33 y.o. female, [id number] at 39.0 weeks, presenting for IOL due to A2GDM, controlled with Glyburide. Pt w/ previous c-section in 2011 due to NRFHRT. Cervix did not progress beyond 2-3 cm. She desires VBAC. She was noted to be an appropriate VBAC candidate on 11/02/13 and consent was obtained at that time. She reports active fetus. Denies LOF, VB or regular ctxs.  Spouse translating. Per pt report last took Glyburide at 2100 last evening. States fingersticks have been good.   Patient Active Problem List   Diagnosis Date Noted  . GDM, class A2 03/10/2014    Priority: High  . Desires VBAC (vaginal birth after cesarean) trial 03/10/2014    Priority: Medium  . Group beta Strep positive 03/10/2014    Priority: Medium  . Pregnancy with history of spontaneous abortion in first trimester, antepartum 03/10/2014  . Language barrier 03/10/2014  . Obesity 03/10/2014  . History of gestational diabetes in prior pregnancy, currently pregnant 03/10/2014    History of present pregnancy: Patient entered care at 10.2 weeks.   EDC of 03/18/14 was established by LMP and that was c/w first trimester scan.   Anatomy scan: 18.5 weeks, with spine not well seen and an anterior placenta. Previously seen ROV cyst now smaller = 1.6 cm x  1.1 cm x 1.1 cm.  Additional US evaluations: [facility].2 wks [facility] to [facility] - no yolk sac, fetal pole or cardiac activity visualized; repeat in 2 wks. 10.5 wks - SIUP, anteverted uterus, left ovary normal, RTOV ovarian septated simple cyst - f/u at anatomy scan. Adnexas WNLs, no fluid in CDS. 20.5 wks - F/U anatomy - Spine anatomy limited due to fetal position, coronal spine visualized. Saggital and transverse spine not well visualized. 2nd trimester screen neg. 35.5 wks - growth scan due to GDM. EFW 2684g, AFI 18.3 cm, BPP 8/8. 37.0 wks - growth scan due to GDM. EFW 2903 g (6lbs 6oz), linear growth, AFI 17.1 cm (75%), BPP 8/8, anterior  placenta. 38.2 wks - BPP 8/8 in 20 minutes. AFI 16.7 cm (60%), anterior placenta.   Significant prenatal events: Seen in [facility] at 5.2 wks due to pelvic pain. See u/s results above. Quants repeated approx 48 hrs later which showed appropriate rise. Experienced 1st & 2nd trimester common discomforts of pregnancy; conservatory measures reviewed and meds provided. 1+ Glycosuria at NOB visit (no CBG performed), then 2+ at 24.5 wks. Fingerstick 107 @ 24.5 wks and 138 @ 31.5 wks. 1hr glucola = 181; 3hr GTT abnormal. Sugars uncontrolled w/ diet/exercise, therefore started on Glyburide 5 mg bid on 02/16/14 @ 35.5 wks; CBGs normal thereafter.   Last evaluation: Office 03/06/14 by [facility], cvx 2/50/-3.  OB History    Gravida Para Term Preterm AB TAB SAB Ectopic Multiple Living   4 2 2  0 1 0 1 0 0 4     SVD on 07/31/2006 @ 40 wks, female infant, birthwt 7-7, [place]; no complications per pt report CS on 06/14/2009 @ 38.5 wks by Dr. Antionette CharLisa Moore-Jackson, female infant, birthwt 6-3, spinal, NRFHRT (transverse incision, double layer closure); no surgical or anesthesia complications per op report SAB on 03/14/2013 @ 6 wks, [facility], no complications   Past Medical History  Diagnosis Date  . Medical history non-contributory   . Complication of anesthesia   . GDM (gestational diabetes mellitus)    Past Surgical History  Procedure Laterality Date  . Cesarean section     Family History: family  history is not on file. Social History:  reports that she has never smoked. She does not have any smokeless tobacco history on file. She reports that she does not drink alcohol or use illicit drugs.Pt is an AA female with 2 years of college, of the Muslim faith, and a homemaker. She does not speak [place]. She is married to Dollar GeneralBabikr Salih. She will accept blood products in the event of an emergency.  Received flu vaccine on 01/05/14 Received Tdap on 02/02/14   Prenatal Transfer Tool  Maternal Diabetes: Yes:  Diabetes Type:   Insulin/Medication controlled Genetic Screening: Normal Maternal Ultrasounds/Referrals: Spine not well seen at 18.5 & 20.5 wks; normal AFP Fetal Ultrasounds or other Referrals:  None Maternal Substance Abuse:  No Significant Maternal Medications:  Meds include: Other: Glyburide 5 mg bid, PNV Significant Maternal Lab Results: Lab values include: Group B Strep positive  ROS: +FM, -VB, -LOF, -ctxs  No Known Allergies     Last menstrual period 06/11/2013, unknown if currently breastfeeding.  Chest clear Heart RRR without murmur Abd gravid, NT, FH CWD Pelvic: 2+/50/-3/med/mid Ext: WNL  FHR: BL 135 w/ mod variability, +accels, no decels UCs: q 6 min, palpate mild  Bishop score: 5  EFW 6-6 at 37 wks  Prenatal labs: ABO, Rh: O/Positive/-- (04/10 0000) Antibody: Negative (04/10 0000) Rubella:   Immune (08/01/13) RPR: Nonreactive (04/10 0000)  HBsAg: Negative (04/10 0000)  HIV: Non-reactive (04/10 0000)  GBS: Positive (11/17 0000)(08/01/13) Sickle cell/Hgb electrophoresis: AA Pap: Normal on 08/22/13 GC:  Neg on 08/01/13 Chlamydia: Neg on 08/01/13 Genetic screenings: Negative AFP Glucola: 1hr 181, abnormal 3hr. Fingerstick 107 @ 24.5 wks; 138 @ 31.5 wks Other: NOB hbg 13.4, 12.7 at 28 wks.   Assessment/Plan: IUP at 39.0 wks A2GDM - controlled with Glyburide Previous c-section desiring VBAC GBS positive Elevated BMI Language barrier Unfavorable cervix   Plan: Admit to BS per c/w Dr. Stefano GaulStringer as attending MD Routine [facility] orders A2GDM: Check sugars q4hr in latent labor then every hour in active labor. Plan to keep keep sugars between 90-120, if higher will start glucose stabilizer GBS prophylaxis Low dose Pitocin Epidural/pain meds as desired Anticipate progress and SVD  Robyne AskewWILLIAMS, KIMBERLYCNM, MS 03/10/2014, 11:06 PM

## 2014-03-11 ENCOUNTER — Encounter (HOSPITAL_COMMUNITY): Payer: Self-pay

## 2014-03-11 ENCOUNTER — Inpatient Hospital Stay (HOSPITAL_COMMUNITY)
Admission: RE | Admit: 2014-03-11 | Discharge: 2014-03-14 | DRG: 775 | Disposition: A | Discharge status: Home / Self Care | Payer: Medicaid Other | Source: Ambulatory Visit | Attending: Obstetrics and Gynecology | Admitting: Obstetrics and Gynecology

## 2014-03-11 VITALS — BP 90/55 | HR 67 | Temp 98.0°F | Resp 20 | Ht 64.0 in | Wt 175.0 lb

## 2014-03-11 DIAGNOSIS — O24420 Gestational diabetes mellitus in childbirth, diet controlled: Principal | ICD-10-CM | POA: Diagnosis present

## 2014-03-11 DIAGNOSIS — IMO0001 Reserved for inherently not codable concepts without codable children: Secondary | ICD-10-CM

## 2014-03-11 DIAGNOSIS — Z789 Other specified health status: Secondary | ICD-10-CM

## 2014-03-11 DIAGNOSIS — O24419 Gestational diabetes mellitus in pregnancy, unspecified control: Secondary | ICD-10-CM

## 2014-03-11 DIAGNOSIS — O3421 Maternal care for scar from previous cesarean delivery: Secondary | ICD-10-CM | POA: Diagnosis present

## 2014-03-11 DIAGNOSIS — B951 Streptococcus, group B, as the cause of diseases classified elsewhere: Secondary | ICD-10-CM

## 2014-03-11 DIAGNOSIS — O09293 Supervision of pregnancy with other poor reproductive or obstetric history, third trimester: Secondary | ICD-10-CM

## 2014-03-11 DIAGNOSIS — O3483 Maternal care for other abnormalities of pelvic organs, third trimester: Secondary | ICD-10-CM | POA: Diagnosis present

## 2014-03-11 DIAGNOSIS — E669 Obesity, unspecified: Secondary | ICD-10-CM

## 2014-03-11 DIAGNOSIS — Z603 Acculturation difficulty: Secondary | ICD-10-CM

## 2014-03-11 DIAGNOSIS — N90810 Female genital mutilation status, unspecified: Secondary | ICD-10-CM | POA: Diagnosis present

## 2014-03-11 DIAGNOSIS — Z8632 Personal history of gestational diabetes: Secondary | ICD-10-CM

## 2014-03-11 DIAGNOSIS — O99824 Streptococcus B carrier state complicating childbirth: Secondary | ICD-10-CM | POA: Diagnosis present

## 2014-03-11 DIAGNOSIS — Z3A39 39 weeks gestation of pregnancy: Secondary | ICD-10-CM | POA: Diagnosis present

## 2014-03-11 DIAGNOSIS — O24410 Gestational diabetes mellitus in pregnancy, diet controlled: Secondary | ICD-10-CM | POA: Diagnosis present

## 2014-03-11 DIAGNOSIS — O34219 Maternal care for unspecified type scar from previous cesarean delivery: Secondary | ICD-10-CM

## 2014-03-11 LAB — TYPE AND SCREEN
ABO/RH(D): O POS
Antibody Screen: NEGATIVE

## 2014-03-11 LAB — CBC
HCT: 37.1 % (ref 36.0–46.0)
HEMOGLOBIN: 12.7 g/dL (ref 12.0–15.0)
MCH: 29.7 pg (ref 26.0–34.0)
MCHC: 34.2 g/dL (ref 30.0–36.0)
MCV: 86.9 fL (ref 78.0–100.0)
PLATELETS: 136 10*3/uL — AB (ref 150–400)
RBC: 4.27 MIL/uL (ref 3.87–5.11)
RDW: 14.1 % (ref 11.5–15.5)
WBC: 5.7 10*3/uL (ref 4.0–10.5)

## 2014-03-11 LAB — GLUCOSE, CAPILLARY: Glucose-Capillary: 142 mg/dL — ABNORMAL HIGH (ref 70–99)

## 2014-03-11 MED ORDER — ONDANSETRON HCL 4 MG/2ML IJ SOLN: 4.0000 mg | Freq: Four times a day (QID) | INTRAMUSCULAR | Status: DC | PRN

## 2014-03-11 MED ORDER — TERBUTALINE SULFATE 1 MG/ML IJ SOLN: 0.2500 mg | Freq: Once | INTRAMUSCULAR | Status: AC | PRN

## 2014-03-11 MED ORDER — ACETAMINOPHEN 325 MG PO TABS: 650.0000 mg | ORAL_TABLET | ORAL | Status: DC | PRN

## 2014-03-11 MED ORDER — NALBUPHINE HCL 10 MG/ML IJ SOLN: 5.0000 mg | INTRAMUSCULAR | Status: DC | PRN

## 2014-03-11 MED ORDER — OXYTOCIN 40 UNITS IN LACTATED RINGERS INFUSION - SIMPLE MED
1.0000 m[IU]/min | INTRAVENOUS | Status: DC
  Administered 2014-03-11: 1 m[IU]/min via INTRAVENOUS
  Filled 2014-03-11: qty 1000

## 2014-03-11 MED ORDER — PENICILLIN G POTASSIUM 5000000 UNITS IJ SOLR
5.0000 10*6.[IU] | Freq: Once | INTRAVENOUS | Status: AC
  Administered 2014-03-11: 5 10*6.[IU] via INTRAVENOUS
  Filled 2014-03-11: qty 5

## 2014-03-11 MED ORDER — CITRIC ACID-SODIUM CITRATE 334-500 MG/5ML PO SOLN: 30.0000 mL | ORAL | Status: DC | PRN

## 2014-03-11 MED ORDER — OXYCODONE-ACETAMINOPHEN 5-325 MG PO TABS: 2.0000 | ORAL_TABLET | ORAL | Status: DC | PRN

## 2014-03-11 MED ORDER — FLEET ENEMA 7-19 GM/118ML RE ENEM: 1.0000 | ENEMA | RECTAL | Status: DC | PRN

## 2014-03-11 MED ORDER — LACTATED RINGERS IV SOLN
INTRAVENOUS | Status: DC
  Administered 2014-03-12: 17:00:00 via INTRAVENOUS
  Administered 2014-03-12: 125 mL/h via INTRAVENOUS
  Administered 2014-03-12 (×2): via INTRAVENOUS

## 2014-03-11 MED ORDER — OXYCODONE-ACETAMINOPHEN 5-325 MG PO TABS: 1.0000 | ORAL_TABLET | ORAL | Status: DC | PRN

## 2014-03-11 MED ORDER — LACTATED RINGERS IV SOLN
500.0000 mL | INTRAVENOUS | Status: DC | PRN
  Administered 2014-03-12: 1000 mL via INTRAVENOUS
  Administered 2014-03-12: 500 mL via INTRAVENOUS
  Administered 2014-03-12: 1000 mL via INTRAVENOUS

## 2014-03-11 MED ORDER — OXYTOCIN BOLUS FROM INFUSION: 500.0000 mL | INTRAVENOUS | Status: DC

## 2014-03-11 MED ORDER — PENICILLIN G POTASSIUM 5000000 UNITS IJ SOLR
2.5000 10*6.[IU] | INTRAVENOUS | Status: DC
  Administered 2014-03-12 (×6): 2.5 10*6.[IU] via INTRAVENOUS
  Filled 2014-03-11 (×8): qty 2.5

## 2014-03-11 MED ORDER — OXYTOCIN 40 UNITS IN LACTATED RINGERS INFUSION - SIMPLE MED
62.5000 mL/h | INTRAVENOUS | Status: DC
  Administered 2014-03-12: 62.5 mL/h via INTRAVENOUS

## 2014-03-11 MED ORDER — LIDOCAINE HCL (PF) 1 % IJ SOLN
30.0000 mL | INTRAMUSCULAR | Status: DC | PRN
  Administered 2014-03-12: 30 mL via SUBCUTANEOUS
  Filled 2014-03-11: qty 30

## 2014-03-11 NOTE — Progress Notes (Signed)
  Subjective: Denies feeling ctxs. Spouse at bedside.  Objective: BP 102/62 mmHg  Pulse 70  Temp(Src) 98.1 F (36.7 C)  Resp 18  Ht 5\' 4"  (1.626 m)  Wt 175 lb (79.379 kg)  BMI 30.02 kg/m2  LMP 06/11/2013     FHT: BL 140 w/ mod variability, +accels, mild variables to 125 for less than 30 secs w/ good return to baseline w/ position change UC:   irregular, every 2-4 minutes, palpate mild SVE: 3/50/-3 Pitocin at 2 mius/min  Intracervical balloon placed w/o difficulty w/ 60 ml NS, traction applied  Results for orders placed or performed during the hospital encounter of 03/11/14 (from the past 24 hour(s))  CBC     Status: Abnormal   Collection Time: 03/11/14  8:16 PM  Result Value Ref Range   WBC 5.7 4.0 - 10.5 K/uL   RBC 4.27 3.87 - 5.11 MIL/uL   Hemoglobin 12.7 12.0 - 15.0 g/dL   HCT 16.137.1 09.636.0 - 04.546.0 %   MCV 86.9 78.0 - 100.0 fL   MCH 29.7 26.0 - 34.0 pg   MCHC 34.2 30.0 - 36.0 g/dL   RDW 40.914.1 81.111.5 - 91.415.5 %   Platelets 136 (L) 150 - 400 K/uL  Type and screen     Status: None   Collection Time: 03/11/14  8:16 PM  Result Value Ref Range   ABO/RH(D) O POS    Antibody Screen NEG    Sample Expiration 03/14/2014   Glucose, capillary     Status: Abnormal   Collection Time: 03/11/14 10:02 PM  Result Value Ref Range   Glucose-Capillary 142 (H) 70 - 99 mg/dL     Assessment:  IUP at 39.0 wks A2GDM GBS positive VBAC candidate Cat 2 FHRT (variables resolved w/ position change - mod variability, overall reassuring FHRT); continue intrauterine resuscitative measures Gestational Thrombocytopenia (noted on admission labs only); pt asymptomatic CBG 142 at 2202; pt ate just prior to coming to hospital  Plan: Continue w/ current plan CBG obtained 2hr pp. Will monitor subsequent CBGs closely and consult prn  Expect progress and SVD  Sherre ScarletWILLIAMS, Jonmarc Bodkin CNM 03/11/2014, 10:40 PM

## 2014-03-11 NOTE — Progress Notes (Signed)
  Subjective: Called to bedside for late decels. Pt. States she is starting to feel cramps, but they are tolerable. Declines the need for pain medication at this time.  Objective: BP 108/76 mmHg  Pulse 77  Temp(Src) 98.1 F (36.7 C)  Resp 20  Ht 5\' 4"  (1.626 m)  Wt 175 lb (79.379 kg)  BMI 30.02 kg/m2  LMP 06/11/2013     FHT: BL 140 w/ mod variability, +accels, late decels x 2 to 80s x 2 min. Gradually returned to baseline w/ position change, 02, increase in IVFs and Pitocin off UC:   irregular, every 2-4 minutes SVE: Deferred   Pitocin was up to 3 mius/min before shutting off Foley bulb taut  Assessment:  IUP at 39.0 wks Cat 2 FHRT (resolved w/ intrauterine resuscitative measures)  Plan: Restart Pitocin in 1hr at 1 miu/min Monitor closely Consult prn   Sherre ScarletWILLIAMS, Zula Hovsepian CNM 03/11/2014, 11:43 PM

## 2014-03-11 NOTE — Telephone Encounter (Signed)
Interpreter number [phone]

## 2014-03-12 ENCOUNTER — Encounter (HOSPITAL_COMMUNITY): Payer: Self-pay

## 2014-03-12 ENCOUNTER — Inpatient Hospital Stay (HOSPITAL_COMMUNITY): Payer: Medicaid Other | Admitting: Anesthesiology

## 2014-03-12 DIAGNOSIS — N90810 Female genital mutilation status, unspecified: Secondary | ICD-10-CM | POA: Diagnosis present

## 2014-03-12 LAB — GLUCOSE, CAPILLARY
GLUCOSE-CAPILLARY: 97 mg/dL (ref 70–99)
GLUCOSE-CAPILLARY: 97 mg/dL (ref 70–99)
Glucose-Capillary: 105 mg/dL — ABNORMAL HIGH (ref 70–99)
Glucose-Capillary: 102 mg/dL — ABNORMAL HIGH (ref 70–99)
Glucose-Capillary: 78 mg/dL (ref 70–99)

## 2014-03-12 LAB — ABO/RH: ABO/RH(D): O POS

## 2014-03-12 LAB — RPR

## 2014-03-12 MED ORDER — DIPHENHYDRAMINE HCL 25 MG PO CAPS: 25.0000 mg | ORAL_CAPSULE | Freq: Four times a day (QID) | ORAL | Status: DC | PRN

## 2014-03-12 MED ORDER — FENTANYL 2.5 MCG/ML BUPIVACAINE 1/10 % EPIDURAL INFUSION (WH - ANES)
INTRAMUSCULAR | Status: DC | PRN
  Administered 2014-03-12: 14 mL/h via EPIDURAL

## 2014-03-12 MED ORDER — LACTATED RINGERS IV SOLN
INTRAVENOUS | Status: DC
  Administered 2014-03-12: 17:00:00 via INTRAUTERINE

## 2014-03-12 MED ORDER — SIMETHICONE 80 MG PO CHEW: 80.0000 mg | CHEWABLE_TABLET | ORAL | Status: DC | PRN

## 2014-03-12 MED ORDER — WITCH HAZEL-GLYCERIN EX PADS
1.0000 "application " | MEDICATED_PAD | CUTANEOUS | Status: DC | PRN
  Administered 2014-03-13: 1 via TOPICAL

## 2014-03-12 MED ORDER — DIPHENHYDRAMINE HCL 50 MG/ML IJ SOLN: 12.5000 mg | INTRAMUSCULAR | Status: DC | PRN

## 2014-03-12 MED ORDER — LIDOCAINE HCL (PF) 1 % IJ SOLN
INTRAMUSCULAR | Status: DC | PRN
  Administered 2014-03-12 (×2): 8 mL

## 2014-03-12 MED ORDER — DIBUCAINE 1 % RE OINT
1.0000 "application " | TOPICAL_OINTMENT | RECTAL | Status: DC | PRN
  Administered 2014-03-13: 1 via RECTAL
  Filled 2014-03-12 (×2): qty 28

## 2014-03-12 MED ORDER — BENZOCAINE-MENTHOL 20-0.5 % EX AERO
1.0000 "application " | INHALATION_SPRAY | CUTANEOUS | Status: DC | PRN
  Administered 2014-03-13: 1 via TOPICAL
  Filled 2014-03-12 (×2): qty 56

## 2014-03-12 MED ORDER — LACTATED RINGERS IV SOLN
500.0000 mL | Freq: Once | INTRAVENOUS | Status: AC
  Administered 2014-03-12: 1000 mL via INTRAVENOUS

## 2014-03-12 MED ORDER — ZOLPIDEM TARTRATE 5 MG PO TABS: 5.0000 mg | ORAL_TABLET | Freq: Every evening | ORAL | Status: DC | PRN

## 2014-03-12 MED ORDER — LANOLIN HYDROUS EX OINT: TOPICAL_OINTMENT | CUTANEOUS | Status: DC | PRN

## 2014-03-12 MED ORDER — PRENATAL MULTIVITAMIN CH
1.0000 | ORAL_TABLET | Freq: Every day | ORAL | Status: DC
  Administered 2014-03-13 – 2014-03-14 (×2): 1 via ORAL
  Filled 2014-03-12 (×2): qty 1

## 2014-03-12 MED ORDER — TETANUS-DIPHTH-ACELL PERTUSSIS 5-2.5-18.5 LF-MCG/0.5 IM SUSP
0.5000 mL | Freq: Once | INTRAMUSCULAR | Status: DC
  Filled 2014-03-12: qty 0.5

## 2014-03-12 MED ORDER — PHENYLEPHRINE 40 MCG/ML (10ML) SYRINGE FOR IV PUSH (FOR BLOOD PRESSURE SUPPORT)
80.0000 ug | PREFILLED_SYRINGE | INTRAVENOUS | Status: DC | PRN
  Filled 2014-03-12: qty 10

## 2014-03-12 MED ORDER — OXYCODONE-ACETAMINOPHEN 5-325 MG PO TABS: 2.0000 | ORAL_TABLET | ORAL | Status: DC | PRN

## 2014-03-12 MED ORDER — FENTANYL 2.5 MCG/ML BUPIVACAINE 1/10 % EPIDURAL INFUSION (WH - ANES)
14.0000 mL/h | INTRAMUSCULAR | Status: DC | PRN
  Filled 2014-03-12: qty 125

## 2014-03-12 MED ORDER — SENNOSIDES-DOCUSATE SODIUM 8.6-50 MG PO TABS
2.0000 | ORAL_TABLET | ORAL | Status: DC
  Administered 2014-03-12 – 2014-03-14 (×2): 2 via ORAL
  Filled 2014-03-12 (×2): qty 2

## 2014-03-12 MED ORDER — EPHEDRINE 5 MG/ML INJ: 10.0000 mg | INTRAVENOUS | Status: DC | PRN

## 2014-03-12 MED ORDER — ONDANSETRON HCL 4 MG/2ML IJ SOLN: 4.0000 mg | INTRAMUSCULAR | Status: DC | PRN

## 2014-03-12 MED ORDER — IBUPROFEN 600 MG PO TABS
600.0000 mg | ORAL_TABLET | Freq: Four times a day (QID) | ORAL | Status: DC
  Administered 2014-03-12 – 2014-03-14 (×7): 600 mg via ORAL
  Filled 2014-03-12 (×7): qty 1

## 2014-03-12 MED ORDER — ONDANSETRON HCL 4 MG PO TABS: 4.0000 mg | ORAL_TABLET | ORAL | Status: DC | PRN

## 2014-03-12 MED ORDER — PHENYLEPHRINE 40 MCG/ML (10ML) SYRINGE FOR IV PUSH (FOR BLOOD PRESSURE SUPPORT): 80.0000 ug | PREFILLED_SYRINGE | INTRAVENOUS | Status: DC | PRN

## 2014-03-12 MED ORDER — OXYCODONE-ACETAMINOPHEN 5-325 MG PO TABS: 1.0000 | ORAL_TABLET | ORAL | Status: DC | PRN

## 2014-03-12 NOTE — Progress Notes (Signed)
  Subjective: Comfortable.  Foley bulb just fell out.  Objective: BP 115/74 mmHg  Pulse 76  Temp(Src) 97.8 F (36.6 C) (Oral)  Resp 18  Ht 5\' 4"  (1.626 m)  Wt 175 lb (79.379 kg)  BMI 30.02 kg/m2  LMP 06/11/2013     CBG (last 3)   Recent Labs  03/12/14 0551 03/12/14 1006 03/12/14 1352  GLUCAP 102* 97 97     FHT: Category 1 UC:   regular, every 7-8 minutes SVE:   6 cm, 75%, vtx, -1.  More cervix on right than left. BBOW--AROM, light MSF. Pitocin at 3 mu.min  Assessment:  Induction Advanced dilation due to foley bulb extrusion Latent labor GBS positive GDM A2  Plan: Continue current care. Internal monitors prn.  Nigel BridgemanLATHAM, Hudsyn Champine CNM 03/12/2014, 2:13 PM

## 2014-03-12 NOTE — Progress Notes (Signed)
  Subjective: Late entry from 0610. Translation through spouse. Feels some cramping. Declines pain medication.  Objective: BP 108/70 mmHg  Pulse 85  Temp(Src) 98.3 F (36.8 C) (Oral)  Resp 20  Ht 5\' 4"  (1.626 m)  Wt 175 lb (79.379 kg)  BMI 30.02 kg/m2  LMP 06/11/2013     FHT: Category 1 UC:   irregular, every 2-4 minutes, palpate mild SVE: Deferred   Pitocin at 4 mius/min Foley bulb taut; +bloody show per RN  Assessment:  IUP at 39.1 wks A2GDM - stable sugars GBS positive Previous c-section  Plan: Continue current plan Defer to oncoming CNM for next evaluation Update Dr. Alois ClicheKulwa  Rhiannon Sassaman, [facility] LL[facility]IMBERLY CNM 03/12/2014, 8:19 AM

## 2014-03-12 NOTE — Progress Notes (Signed)
Confirmed with [place]

## 2014-03-12 NOTE — Progress Notes (Signed)
  Subjective: Epidural just placed.  Patient comfortable.  Objective: BP 102/64 mmHg  Pulse 64  Temp(Src) 98.4 F (36.9 C) (Oral)  Resp 20  Ht 5\' 4"  (1.626 m)  Wt 175 lb (79.379 kg)  BMI 30.02 kg/m2  LMP 06/11/2013      FHT: Category 2--6 min decel with moderate variability, then 2 min decel that followed. IUPC and FSE applied. Leaking large amount light MSF. Vtx not well-applied to cervix, cervix floppy. UC:   regular, every 2 minutes during decel, now q 3 after pitocin d/c'd SVE:   8 cm, but floppy cervix, 75%, vtx, -1, but asynclitic.  More cervix on right than left. Pitocin at 3 mu/min prior to d/c.  Amnioinfusion begun.  Assessment:  Progressive labor Asynclitic fetal position Category 2 FHR  Plan: Close observation of FHR status. Position to facilitate rotation/descent. Defer restart of pitocin at present.  Mariah Joyce, Mariah Joyce CNM 03/12/2014, 4:39 PM

## 2014-03-12 NOTE — Progress Notes (Addendum)
  Subjective: Comfortable, family members at bedside.  Objective: BP 99/72 mmHg  Pulse 74  Temp(Src) 98.3 F (36.8 C) (Oral)  Resp 18  Ht 5\' 4"  (1.626 m)  Wt 175 lb (79.379 kg)  BMI 30.02 kg/m2  LMP 06/11/2013      FHT: Category 1 at present--previous sporadic variable decels, moderate variability throughtout. UC:   irregular, every 2-4 minutes SVE:   Foley still in place  Pitocin at 6 mu/min  Assessment:  Latent/early labor  Plan: Continue current care.  Nigel BridgemanLATHAM, Ryelan Kazee CNM 03/12/2014, 10:06 AM

## 2014-03-12 NOTE — Progress Notes (Signed)
BP noted, readjusted

## 2014-03-12 NOTE — Progress Notes (Signed)
  Subjective: Remains comfortable with epidural.  Objective: BP 102/64 mmHg  Pulse 64  Temp(Src) 98.4 F (36.9 C) (Oral)  Resp 20  Ht 5\' 4"  (1.626 m)  Wt 175 lb (79.379 kg)  BMI 30.02 kg/m2  LMP 06/11/2013      FHT: Category 2--occasional late decels, but moderate variability, positive scalp stim UC:   irregular, every 2-5 minutes SVE:   Dilation: 7 Effacement (%): 70, 80 Station: -1 (asynclitic) Exam by:: Nigel BridgemanVicki Bertie Joyce, CNM Vtx better applied now, mild molding noted, but less asynclitic now.  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  Assessment:  Induction, previous C/S, prior vaginal delivery, GBS positive, GDM A2 Category 2 FHR  Plan: Restart pitocin. Close observation of FHR status.  Nigel BridgemanLATHAM, Talal Fritchman CNM 03/12/2014, 5:51 PM

## 2014-03-12 NOTE — Plan of Care (Signed)
Problem: Phase I Progression Outcomes Goal: Initial discharge plan identified Outcome: Completed/Met Date Met:  03/12/14     

## 2014-03-12 NOTE — Anesthesia Procedure Notes (Signed)
Epidural Patient location during procedure: OB Start time: 03/12/2014 3:58 PM End time: 03/12/2014 4:02 PM  Staffing Anesthesiologist: Leilani AbleHATCHETT, Deronte Solis  Preanesthetic Checklist Completed: patient identified, surgical consent, pre-op evaluation, timeout performed, IV checked, risks and benefits discussed and monitors and equipment checked  Epidural Patient position: sitting Prep: site prepped and draped and DuraPrep Patient monitoring: continuous pulse ox and blood pressure Approach: midline Location: L3-L4 Injection technique: LOR air  Needle:  Needle type: Tuohy  Needle gauge: 17 G Needle length: 9 cm and 9 Needle insertion depth: 6 cm Catheter type: closed end flexible Catheter size: 19 Gauge Catheter at skin depth: 11 cm Test dose: negative and Other  Assessment Events: blood not aspirated, injection not painful, no injection resistance, negative IV test and no paresthesia  Additional Notes Reason for block:procedure for pain

## 2014-03-12 NOTE — Progress Notes (Signed)
  Subjective: Called regarding prolonged decel x 8 minutes.  Pitocin off.  Foley still in place. Late decel x 1 after prolonged decel, now resolved.  Objective: BP 101/64 mmHg  Pulse 72  Temp(Src) 98.3 F (36.8 C) (Oral)  Resp 20  Ht 5\' 4"  (1.626 m)  Wt 175 lb (79.379 kg)  BMI 30.02 kg/m2  LMP 06/11/2013      FHT: Category 2 at present, but no further decels UC:   q 2-4 min, mild SVE:   Foley bulb still in place Pitocin off--was on 6 mu/min prior to d/c  Assessment:  Category 2 FHR TOLAC Foley bulb in place  Plan: Close observation of FHR status.  Nigel BridgemanLATHAM, Blossie Raffel CNM 03/12/2014, 10:41 AM

## 2014-03-12 NOTE — Progress Notes (Addendum)
  Subjective: Overall comfortable, aware of mild cramping.    Objective: BP 116/73 mmHg  Pulse 66  Temp(Src) 98.3 F (36.8 C) (Oral)  Resp 20  Ht 5\' 4"  (1.626 m)  Wt 175 lb (79.379 kg)  BMI 30.02 kg/m2  LMP 06/11/2013      FHT: Category 1 at present--Decels x 2 around 7a, x 2 min in duration.  Category 1 before and after. UC:   irregular, every 2-6 minutes SVE:   Deferred--foley bulb still in place Small amount bloody show.  Pitocin at 5 mu/min  CBG (last 3)   Recent Labs  03/11/14 2202 03/12/14 0204 03/12/14 0551  GLUCAP 142* 105* 102*     Assessment:  IUP at 39 1/7 weeks Induction for GDM A2, on glyburide GBS positive [facility], prior vaginal delivery Language barrier  Plan: Continue pitocin and foley bulb. Consider AROM when foley extruded, if vtx well-applied. Pain med/epidural prn. Close monitoring of FHR status. CBGs q 4 hours.  Nigel BridgemanLATHAM, Lexiana Spindel CNM 03/12/2014, 8:06 AM

## 2014-03-12 NOTE — Progress Notes (Signed)
  Subjective: Resting comfortably.  Husband at bedside.  Objective: BP 103/61 mmHg  Pulse 75  Temp(Src) 97.9 F (36.6 C) (Oral)  Resp 18  Ht 5\' 4"  (1.626 m)  Wt 175 lb (79.379 kg)  BMI 30.02 kg/m2  LMP 06/11/2013      FHT: Category 1--no decels since 10:28a UC:   q 10 min, mild SVE:  Foley bulb still in place Vtx presentation verified by BS US    Assessment:  Induction--GDM A2, previous C/S, prior vaginal delivery, GBS positive Latent phase labor Sporadic decels, but overall reassuring FHR   Plan: Restart pitocin Dr. Sallye OberKulwa updated.  Mariah Joyce, Kebron Pulse CNM 03/12/2014, 12:32 PM

## 2014-03-12 NOTE — Anesthesia Preprocedure Evaluation (Signed)
Anesthesia Evaluation  Patient identified by MRN, date of birth, ID band Patient awake    Reviewed: Allergy & Precautions, H&P , NPO status , Patient's Chart, lab work & pertinent test results  Airway Mallampati: I  TM Distance: >3 FB Neck ROM: full    Dental no notable dental hx.    Pulmonary neg pulmonary ROS,    Pulmonary exam normal       Cardiovascular negative cardio ROS      Neuro/Psych negative neurological ROS  negative psych ROS   GI/Hepatic negative GI ROS, Neg liver ROS,   Endo/Other    Renal/GU negative Renal ROS     Musculoskeletal   Abdominal Normal abdominal exam  (+)   Peds  Hematology negative hematology ROS (+)   Anesthesia Other Findings   Reproductive/Obstetrics (+) Pregnancy                             Anesthesia Physical Anesthesia Plan  ASA: II  Anesthesia Plan: Epidural   Post-op Pain Management:    Induction:   Airway Management Planned:   Additional Equipment:   Intra-op Plan:   Post-operative Plan:   Informed Consent: I have reviewed the patients History and Physical, chart, labs and discussed the procedure including the risks, benefits and alternatives for the proposed anesthesia with the patient or authorized representative who has indicated his/her understanding and acceptance.     Plan Discussed with:   Anesthesia Plan Comments:         Anesthesia Quick Evaluation

## 2014-03-12 NOTE — Plan of Care (Signed)
Problem: Consults Goal: Postpartum Patient Education (See Patient Education module for education specifics.) Outcome: Completed/Met Date Met:  03/12/14  Problem: Phase I Progression Outcomes Goal: Pain controlled with appropriate interventions Outcome: Completed/Met Date Met:  03/12/14 Goal: VS, stable, temp < 100.4 degrees F Outcome: Completed/Met Date Met:  03/12/14

## 2014-03-13 LAB — CBC
HEMATOCRIT: 33.8 % — AB (ref 36.0–46.0)
Hemoglobin: 11.5 g/dL — ABNORMAL LOW (ref 12.0–15.0)
MCH: 29.9 pg (ref 26.0–34.0)
MCHC: 34 g/dL (ref 30.0–36.0)
MCV: 87.8 fL (ref 78.0–100.0)
Platelets: 115 10*3/uL — ABNORMAL LOW (ref 150–400)
RBC: 3.85 MIL/uL — ABNORMAL LOW (ref 3.87–5.11)
RDW: 14.3 % (ref 11.5–15.5)
WBC: 8.1 10*3/uL (ref 4.0–10.5)

## 2014-03-13 LAB — GLUCOSE, CAPILLARY
Glucose-Capillary: 81 mg/dL (ref 70–99)
Glucose-Capillary: 101 mg/dL — ABNORMAL HIGH (ref 70–99)
Glucose-Capillary: 107 mg/dL — ABNORMAL HIGH (ref 70–99)

## 2014-03-13 NOTE — Anesthesia Postprocedure Evaluation (Signed)
Anesthesia Post Note  Patient: Mariah Joyce  Procedure(s) Performed: * No procedures listed *  Anesthesia type: Epidural  Patient location: Mother/Baby  Post pain: Pain level controlled  Post assessment: Post-op Vital signs reviewed  Last Vitals:  Filed Vitals:   03/13/14 0503  BP: 99/64  Pulse: 71  Temp: 36.7 C  Resp: 20    Post vital signs: Reviewed  Level of consciousness:alert  Complications: No apparent anesthesia complications

## 2014-03-13 NOTE — Lactation Note (Signed)
This note was copied from the chart of Mariah Joyce. Lactation Consultation Note Experienced BF mom BF her 2 older children for 2 yrs. Each and supplemented w/formula. Had no difficulty. Mom is BF this baby w/o difficulty per dad. Mom is resting and will answer occasionally. Dad is interpreter for mom and has signed papers. Dad speaks good english. Mom encouraged to feed baby 8-12 times/24 hours and with feeding cues. Educated about newborn behavior. Mom encouraged to do skin-to-skin. WH/LC brochure given w/resources, support groups and LC services. Referred to Baby and Me Book in Breastfeeding section Pg. 22-23 for position options and Proper latch demonstration.  Patient Name: Mariah Trude McburneyHiba Drewry UJWJX'BToday's Date: 03/13/2014 Reason for consult: Initial assessment   Maternal Data Has patient been taught Hand Expression?: Yes Does the patient have breastfeeding experience prior to this delivery?: Yes  Feeding Feeding Type: Breast Fed Length of feed: 15 min  LATCH Score/Interventions                      Lactation Tools Discussed/Used     Consult Status Consult Status: Follow-up Date: 03/14/14 Follow-up type: In-patient    Martesha Niedermeier, Diamond NickelLAURA G 03/13/2014, 6:10 AM

## 2014-03-13 NOTE — Discharge Summary (Signed)
Vaginal Delivery Discharge Summary  ALL information will be verified prior to discharge  Mariah Joyce  DOB:    [birth year] MRN:    [id number] CSN:    [id number]  Date of admission:                  03/11/14  Date of discharge:                   03/14/14  Procedures this admission: VBAC  Date of Delivery: 03/12/14  Newborn Data:  Live born  Information for the patient's newborn:  Mariah Joyce [[id number]]  female  Live born female  Birth Weight: 6 lb 14.8 oz (3140 g) APGAR: 9, 9  Home with mother. Name: Mariah Joyce Plan: OP  History of Present Illness: [name] is a 33 y.o. female, [phone], who presents at [redacted]w[redacted]d weeks gestation. The patient has been followed at the [facility] Gy[facility]cology division of [facility]. She was admitted induction of labor. Her pregnancy has been complicated by:  Patient Active Problem List   Diagnosis Date Noted  . Female Joyce 03/12/2014  . NSVD (normal spontaneous vaginal delivery) 03/12/2014  . H/O vaginal delivery prior to C/S 03/11/2014  . Gestational diabetes mellitus, class A2 03/11/2014  . Pregnancy with history of spontaneous abortion in first trimester, antepartum 03/10/2014  . Desires VBAC (vaginal birth after cesarean) trial 03/10/2014  . Language barrier 03/10/2014  . GDM, class A2 03/10/2014  . Group beta Strep positive 03/10/2014  . Obesity 03/10/2014  . History of gestational diabetes in prior pregnancy, currently pregnant 03/10/2014    Hospital course: The patient was admitted for IOL.   Her labor was not complicated. She proceeded to have a vaginal delivery of a healthy infant. Her delivery was not complicated. Her postpartum course was not complicated. She was discharged to home on postpartum day 2 doing well.  Feeding: breast  Contraception: unsure Pt understands the risks birth control are not limited to irregular bleeding,  formation of DVT, fluid fluctuations, elevation in blood pressure, stroke, breast tenderness and liver damage.  She states she will report any serious side effects.  She has been given verbal and written instructions and voiced a clear understanding.    Discharge hemoglobin: HEMOGLOBIN  Date Value Ref Range Status  03/13/2014 11.5* 12.0 - 15.0 g/dL Final   HCT  Date Value Ref Range Status  03/13/2014 33.8* 36.0 - 46.0 % Final    PreNatal Labs ABO, Rh: --/--/O POS, O POS (11/18 2016)   Antibody: NEG (11/18 2016) Rubella:   immune RPR: NON REAC (11/18 2016)  HBsAg: Negative (04/10 0000)  HIV: Non-reactive (04/10 0000)  GBS: Positive (11/17 0000)  Discharge Physical Exam:  General: alert and cooperative Lochia: appropriate Uterine Fundus: firm Incision: healing well DVT Evaluation: No evidence of DVT seen on physical exam.  Intrapartum Procedures: VBAC Postpartum Procedures: none Complications-Operative and Postpartum: Episiotomy: Right mediolateral  Discharge Diagnoses: Term Pregnancy-delivered  Activity:           pelvic rest Diet:                routine Medications: PNV, Ibuprofen and Percocet Condition:      stable     Postpartum Teaching: Nutrition, exercise, return to work or school, family visits, sexual activity, home rest, vaginal bleeding, pelvic rest, family planning, s/s of PPD, breast care peri-care and incision care   Discharge to: home  Follow-up Information  [facility].   Specialty:  Obstetrics and Gynecology   Why:  Postpartum check up   Contact information:   [address] [place] [phone]       Mariah MingsVenus Login Joyce, CNM, MSN 03/13/2014. 10:54 AM   Postpartum Care After Vaginal Delivery  After you deliver your newborn (postpartum period), the usual stay in the hospital is 24 72 hours. If there were problems with your labor or delivery, or if you  have other medical problems, you might be in the hospital longer.  While you are in the hospital, you will receive help and instructions on how to care for yourself and your newborn during the postpartum period.  While you are in the hospital:  Be sure to tell your nurses if you have pain or discomfort, as well as where you feel the pain and what makes the pain worse.  If you had an incision made near your vagina (episiotomy) or if you had some tearing during delivery, the nurses may put ice packs on your episiotomy or tear. The ice packs may help to reduce the pain and swelling.  If you are breastfeeding, you may feel uncomfortable contractions of your uterus for a couple of weeks. This is normal. The contractions help your uterus get back to normal size.  It is normal to have some bleeding after delivery.  For the first 1 3 days after delivery, the flow is red and the amount may be similar to a period.  It is common for the flow to start and stop.  In the first few days, you may pass some small clots. Let your nurses know if you begin to pass large clots or your flow increases.  Do not  flush blood clots down the toilet before having the nurse look at them.  During the next 3 10 days after delivery, your flow should become more watery and pink or brown-tinged in color.  Ten to fourteen days after delivery, your flow should be a small amount of yellowish-white discharge.  The amount of your flow will decrease over the first few weeks after delivery. Your flow may stop in 6 8 weeks. Most women have had their flow stop by 12 weeks after delivery.  You should change your sanitary pads frequently.  Wash your hands thoroughly with soap and water for at least 20 seconds after changing pads, using the toilet, or before holding or feeding your newborn.  You should feel like you need to empty your bladder within the first 6 8 hours after delivery.  In case you become weak, lightheaded, or  faint, call your nurse before you get out of bed for the first time and before you take a shower for the first time.  Within the first few days after delivery, your breasts may begin to feel tender and full. This is called engorgement. Breast tenderness usually goes away within 48 72 hours after engorgement occurs. You may also notice milk leaking from your breasts. If you are not breastfeeding, do not stimulate your breasts. Breast stimulation can make your breasts produce more milk.  Spending as much time as possible with your newborn is very important. During this time, you and your newborn can feel close and get to know each other. Having your newborn stay in your room (rooming in) will help to strengthen the bond with your newborn. It will give you time to get to know your newborn and become  comfortable caring for your newborn.  Your hormones change after delivery. Sometimes the hormone changes can temporarily cause you to feel sad or tearful. These feelings should not last more than a few days. If these feelings last longer than that, you should talk to your caregiver.  If desired, talk to your caregiver about methods of family planning or contraception.  Talk to your caregiver about immunizations. Your caregiver may want you to have the following immunizations before leaving the hospital:  Tetanus, diphtheria, and pertussis (Tdap) or tetanus and diphtheria (Td) immunization. It is very important that you and your family (including grandparents) or others caring for your newborn are up-to-date with the Tdap or Td immunizations. The Tdap or Td immunization can help protect your newborn from getting ill.  Rubella immunization.  Varicella (chickenpox) immunization.  Influenza immunization. You should receive this annual immunization if you did not receive the immunization during your pregnancy. Document Released: 02/05/2007 Document Revised: 01/03/2012 Document Reviewed: 12/06/2011 [facility]  Patient Information 2014 [place], [place].   Postpartum Depression and Baby Blues  The postpartum period begins right after the birth of a baby. During this time, there is often a great amount of joy and excitement. It is also a time of considerable changes in the life of the parent(s). Regardless of how many times a mother gives birth, each child brings new challenges and dynamics to the family. It is not unusual to have feelings of excitement accompanied by confusing shifts in moods, emotions, and thoughts. All mothers are at risk of developing postpartum depression or the "baby blues." These mood changes can occur right after giving birth, or they may occur many months after giving birth. The baby blues or postpartum depression can be mild or severe. Additionally, postpartum depression can resolve rather quickly, or it can be a long-term condition. CAUSES Elevated hormones and their rapid decline are thought to be a main cause of postpartum depression and the baby blues. There are a number of hormones that radically change during and after pregnancy. Estrogen and progesterone usually decrease immediately after delivering your baby. The level of thyroid hormone and various cortisol steroids also rapidly drop. Other factors that play a major role in these changes include major life events and genetics.  RISK FACTORS If you have any of the following risks for the baby blues or postpartum depression, know what symptoms to watch out for during the postpartum period. Risk factors that may increase the likelihood of getting the baby blues or postpartum depression include: 1. Havinga personal or family history of depression. 2. Having depression while being pregnant. 3. Having premenstrual or oral contraceptive-associated mood issues. 4. Having exceptional life stress. 5. Having marital conflict. 6. Lacking a social support network. 7. Having a baby with special needs. 8. Having health problems such as  diabetes. SYMPTOMS Baby blues symptoms include:  Brief fluctuations in mood, such as going from extreme happiness to sadness.  Decreased concentration.  Difficulty sleeping.  Crying spells, tearfulness.  Irritability.  Anxiety. Postpartum depression symptoms typically begin within the first month after giving birth. These symptoms include:  Difficulty sleeping or excessive sleepiness.  Marked weight loss.  Agitation.  Feelings of worthlessness.  Lack of interest in activity or food. Postpartum psychosis is a very concerning condition and can be dangerous. Fortunately, it is rare. Displaying any of the following symptoms is cause for immediate medical attention. Postpartum psychosis symptoms include:  Hallucinations and delusions.  Bizarre or disorganized behavior.  Confusion or disorientation. DIAGNOSIS  A  diagnosis is made by an evaluation of your symptoms. There are no medical or lab tests that lead to a diagnosis, but there are various questionnaires that a caregiver may use to identify those with the baby blues, postpartum depression, or psychosis. Often times, a screening tool called the [place] Postnatal Depression Scale is used to diagnose depression in the postpartum period.  TREATMENT The baby blues usually goes away on its own in 1 to 2 weeks. Social support is often all that is needed. You should be encouraged to get adequate sleep and rest. Occasionally, you may be given medicines to help you sleep.  Postpartum depression requires treatment as it can last several months or longer if it is not treated. Treatment may include individual or group therapy, medicine, or both to address any social, physiological, and psychological factors that may play a role in the depression. Regular exercise, a healthy diet, rest, and social support may also be strongly recommended.  Postpartum psychosis is more serious and needs treatment right away. Hospitalization is often  needed. HOME CARE INSTRUCTIONS  Get as much rest as you can. Nap when the baby sleeps.  Exercise regularly. Some women find yoga and walking to be beneficial.  Eat a balanced and nourishing diet.  Do little things that you enjoy. Have a cup of tea, take a bubble bath, read your favorite magazine, or listen to your favorite music.  Avoid alcohol.  Ask for help with household chores, cooking, grocery shopping, or running errands as needed. Do not try to do everything.  Talk to people close to you about how you are feeling. Get support from your partner, family members, friends, or other new moms.  Try to stay positive in how you think. Think about the things you are grateful for.  Do not spend a lot of time alone.  Only take medicine as directed by your caregiver.  Keep all your postpartum appointments.  Let your caregiver know if you have any concerns. SEEK MEDICAL CARE IF: You are having a reaction or problems with your medicine. SEEK IMMEDIATE MEDICAL CARE IF:  You have suicidal feelings.  You feel you may harm the baby or someone else. Document Released: 01/13/2004 Document Revised: 07/03/2011 Document Reviewed: 02/14/2011 [facility] Patient Information 2014 [place], [place].     Breastfeeding Deciding to breastfeed is one of the best choices you can make for you and your baby. A change in hormones during pregnancy causes your breast tissue to grow and increases the number and size of your milk ducts. These hormones also allow proteins, sugars, and fats from your blood supply to make breast milk in your milk-producing glands. Hormones prevent breast milk from being released before your baby is born as well as prompt milk flow after birth. Once breastfeeding has begun, thoughts of your baby, as well as his or her sucking or crying, can stimulate the release of milk from your milk-producing glands.  BENEFITS OF BREASTFEEDING For Your Baby  Your first milk (colostrum) helps  your baby's digestive system function better.   There are antibodies in your milk that help your baby fight off infections.   Your baby has a lower incidence of asthma, allergies, and sudden infant death syndrome.   The nutrients in breast milk are better for your baby than infant formulas and are designed uniquely for your baby's needs.   Breast milk improves your baby's brain development.   Your baby is less likely to develop other conditions, such as childhood obesity, asthma,  or type 2 diabetes mellitus.  For You   Breastfeeding helps to create a very special bond between you and your baby.   Breastfeeding is convenient. Breast milk is always available at the correct temperature and costs nothing.   Breastfeeding helps to burn calories and helps you lose the weight gained during pregnancy.   Breastfeeding makes your uterus contract to its prepregnancy size faster and slows bleeding (lochia) after you give birth.   Breastfeeding helps to lower your risk of developing type 2 diabetes mellitus, osteoporosis, and breast or ovarian cancer later in life. SIGNS THAT YOUR BABY IS HUNGRY Early Signs of Hunger  Increased alertness or activity.  Stretching.  Movement of the head from side to side.  Movement of the head and opening of the mouth when the corner of the mouth or cheek is stroked (rooting).  Increased sucking sounds, smacking lips, cooing, sighing, or squeaking.  Hand-to-mouth movements.  Increased sucking of fingers or hands. Late Signs of Hunger  Fussing.  Intermittent crying. Extreme Signs of Hunger Signs of extreme hunger will require calming and consoling before your baby will be able to breastfeed successfully. Do not wait for the following signs of extreme hunger to occur before you initiate breastfeeding:   Restlessness.  A loud, strong cry.   Screaming.   BREASTFEEDING BASICS Breastfeeding Initiation  Find a comfortable place to sit or  lie down, with your neck and back well supported.  Place a pillow or rolled up blanket under your baby to bring him or her to the level of your breast (if you are seated). Nursing pillows are specially designed to help support your arms and your baby while you breastfeed.  Make sure that your baby's abdomen is facing your abdomen.   Gently massage your breast. With your fingertips, massage from your chest wall toward your nipple in a circular motion. This encourages milk flow. You may need to continue this action during the feeding if your milk flows slowly.  Support your breast with 4 fingers underneath and your thumb above your nipple. Make sure your fingers are well away from your nipple and your baby's mouth.   Stroke your baby's lips gently with your finger or nipple.   When your baby's mouth is open wide enough, quickly bring your baby to your breast, placing your entire nipple and as much of the colored area around your nipple (areola) as possible into your baby's mouth.   More areola should be visible above your baby's upper lip than below the lower lip.   Your baby's tongue should be between his or her lower gum and your breast.   Ensure that your baby's mouth is correctly positioned around your nipple (latched). Your baby's lips should create a seal on your breast and be turned out (everted).  It is common for your baby to suck about 2-3 minutes in order to start the flow of breast milk. Latching Teaching your baby how to latch on to your breast properly is very important. An improper latch can cause nipple pain and decreased milk supply for you and poor weight gain in your baby. Also, if your baby is not latched onto your nipple properly, he or she may swallow some air during feeding. This can make your baby fussy. Burping your baby when you switch breasts during the feeding can help to get rid of the air. However, teaching your baby to latch on properly is still the best way  to prevent fussiness from swallowing  air while breastfeeding. Signs that your baby has successfully latched on to your nipple:    Silent tugging or silent sucking, without causing you pain.   Swallowing heard between every 3-4 sucks.    Muscle movement above and in front of his or her ears while sucking.  Signs that your baby has not successfully latched on to nipple:   Sucking sounds or smacking sounds from your baby while breastfeeding.  Nipple pain. If you think your baby has not latched on correctly, slip your finger into the corner of your baby's mouth to break the suction and place it between your baby's gums. Attempt breastfeeding initiation again. Signs of Successful Breastfeeding Signs from your baby:   A gradual decrease in the number of sucks or complete cessation of sucking.   Falling asleep.   Relaxation of his or her body.   Retention of a small amount of milk in his or her mouth.   Letting go of your breast by himself or herself. Signs from you:  Breasts that have increased in firmness, weight, and size 1-3 hours after feeding.   Breasts that are softer immediately after breastfeeding.  Increased milk volume, as well as a change in milk consistency and color by the fifth day of breastfeeding.   Nipples that are not sore, cracked, or bleeding. Signs That Your Pecola Leisure is Getting Enough Milk  Wetting at least 3 diapers in a 24-hour period. The urine should be clear and pale yellow by age 69 days.  At least 3 stools in a 24-hour period by age 69 days. The stool should be soft and yellow.  At least 3 stools in a 24-hour period by age 32 days. The stool should be seedy and yellow.  No loss of weight greater than 10% of birth weight during the first 67 days of age.  Average weight gain of 4-7 ounces (113-198 g) per week after age 32 days.  Consistent daily weight gain by age 69 days, without weight loss after the age of 2 weeks. After a feeding, your baby may  spit up a small amount. This is common. BREASTFEEDING FREQUENCY AND DURATION Frequent feeding will help you make more milk and can prevent sore nipples and breast engorgement. Breastfeed when you feel the need to reduce the fullness of your breasts or when your baby shows signs of hunger. This is called "breastfeeding on demand." Avoid introducing a pacifier to your baby while you are working to establish breastfeeding (the first 4-6 weeks after your baby is born). After this time you may choose to use a pacifier. Research has shown that pacifier use during the first year of a baby's life decreases the risk of sudden infant death syndrome (SIDS). Allow your baby to feed on each breast as long as he or she wants. Breastfeed until your baby is finished feeding. When your baby unlatches or falls asleep while feeding from the first breast, offer the second breast. Because newborns are often sleepy in the first few weeks of life, you may need to awaken your baby to get him or her to feed. Breastfeeding times will vary from baby to baby. However, the following rules can serve as a guide to help you ensure that your baby is properly fed:  Newborns (babies 41 weeks of age or younger) may breastfeed every 1-3 hours.  Newborns should not go longer than 3 hours during the day or 5 hours during the night without breastfeeding.  You should breastfeed your baby a  minimum of 8 times in a 24-hour period until you begin to introduce solid foods to your baby at around 42 months of age. BREAST MILK PUMPING Pumping and storing breast milk allows you to ensure that your baby is exclusively fed your breast milk, even at times when you are unable to breastfeed. This is especially important if you are going back to work while you are still breastfeeding or when you are not able to be present during feedings. Your lactation consultant can give you guidelines on how long it is safe to store breast milk.  A breast pump is a machine  that allows you to pump milk from your breast into a sterile bottle. The pumped breast milk can then be stored in a refrigerator or freezer. Some breast pumps are operated by hand, while others use electricity. Ask your lactation consultant which type will work best for you. Breast pumps can be purchased, but some hospitals and breastfeeding support groups lease breast pumps on a monthly basis. A lactation consultant can teach you how to hand express breast milk, if you prefer not to use a pump.  CARING FOR YOUR BREASTS WHILE YOU BREASTFEED Nipples can become dry, cracked, and sore while breastfeeding. The following recommendations can help keep your breasts moisturized and healthy:  Avoid using soap on your nipples.   Wear a supportive bra. Although not required, special nursing bras and tank tops are designed to allow access to your breasts for breastfeeding without taking off your entire bra or top. Avoid wearing underwire-style bras or extremely tight bras.  Air dry your nipples for 3-25minutes after each feeding.   Use only cotton bra pads to absorb leaked breast milk. Leaking of breast milk between feedings is normal.   Use lanolin on your nipples after breastfeeding. Lanolin helps to maintain your skin's normal moisture barrier. If you use pure lanolin, you do not need to wash it off before feeding your baby again. Pure lanolin is not toxic to your baby. You may also hand express a few drops of breast milk and gently massage that milk into your nipples and allow the milk to air dry. In the first few weeks after giving birth, some women experience extremely full breasts (engorgement). Engorgement can make your breasts feel heavy, warm, and tender to the touch. Engorgement peaks within 3-5 days after you give birth. The following recommendations can help ease engorgement:  Completely empty your breasts while breastfeeding or pumping. You may want to start by applying warm, moist heat (in the  shower or with warm water-soaked hand towels) just before feeding or pumping. This increases circulation and helps the milk flow. If your baby does not completely empty your breasts while breastfeeding, pump any extra milk after he or she is finished.  Wear a snug bra (nursing or regular) or tank top for 1-2 days to signal your body to slightly decrease milk production.  Apply ice packs to your breasts, unless this is too uncomfortable for you.  Make sure that your baby is latched on and positioned properly while breastfeeding. If engorgement persists after 48 hours of following these recommendations, contact your health care provider or a Advertising copywriter. OVERALL HEALTH CARE RECOMMENDATIONS WHILE BREASTFEEDING  Eat healthy foods. Alternate between meals and snacks, eating 3 of each per day. Because what you eat affects your breast milk, some of the foods may make your baby more irritable than usual. Avoid eating these foods if you are sure that they are negatively affecting  your baby.  Drink milk, fruit juice, and water to satisfy your thirst (about 10 glasses a day).   Rest often, relax, and continue to take your prenatal vitamins to prevent fatigue, stress, and anemia.  Continue breast self-awareness checks.  Avoid chewing and smoking tobacco.  Avoid alcohol and drug use. Some medicines that may be harmful to your baby can pass through breast milk. It is important to ask your health care provider before taking any medicine, including all over-the-counter and prescription medicine as well as vitamin and herbal supplements. It is possible to become pregnant while breastfeeding. If birth control is desired, ask your health care provider about options that will be safe for your baby. SEEK MEDICAL CARE IF:   You feel like you want to stop breastfeeding or have become frustrated with breastfeeding.  You have painful breasts or nipples.  Your nipples are cracked or bleeding.  Your  breasts are red, tender, or warm.  You have a swollen area on either breast.  You have a fever or chills.  You have nausea or vomiting.  You have drainage other than breast milk from your nipples.  Your breasts do not become full before feedings by the fifth day after you give birth.  You feel sad and depressed.  Your baby is too sleepy to eat well.  Your baby is having trouble sleeping.   Your baby is wetting less than 3 diapers in a 24-hour period.  Your baby has less than 3 stools in a 24-hour period.  Your baby's skin or the white part of his or her eyes becomes yellow.   Your baby is not gaining weight by 65 days of age. SEEK IMMEDIATE MEDICAL CARE IF:   Your baby is overly tired (lethargic) and does not want to wake up and feed.  Your baby develops an unexplained fever. Document Released: 04/10/2005 Document Revised: 04/15/2013 Document Reviewed: 10/02/2012 [facility] Patient Information 2015 [place]. This information is not intended to replace advice given to you by your health care provider. Make sure you discuss any questions you have with your health care provider.

## 2014-03-13 NOTE — Progress Notes (Signed)
Mariah Joyce   Subjective: Post Partum Day 1 Vaginal delivery, Episiotomy: Right mediolateral Patient up ad lib, denies syncope or dizziness. Reports consuming regular diet without issues and denies N/V No issues with urination and reports bleeding is appropriate  Feeding:  breast Contraceptive plan:   unsure  Objective: Temp:  [97.8 F (36.6 C)-98.7 F (37.1 C)] 98 F (36.7 C) (11/20 0503) Pulse Rate:  [56-144] 71 (11/20 0503) Resp:  [18-24] 20 (11/20 0503) BP: (81-115)/(20-86) 99/64 mmHg (11/20 0503)  Physical Exam:  General: alert and cooperative Ext: WNL, no edema. No evidence of DVT seen on physical exam. Breast: Soft filling Lungs: CTAB Heart RRR without murmur  Abdomen:  Soft, fundus firm, lochia scant, + bowel sounds, non distended, non tender Lochia: appropriate Uterine Fundus: firm Laceration: healing well    Recent Labs  03/11/14 2016 03/13/14 0605  HGB 12.7 11.5*  HCT 37.1 33.8*    Assessment S/P Vaginal Delivery-Day 1 Stable  Normal Involution Breastfeeding Circumcision: OP  Plan: Continue current care Plan for discharge tomorrow and Breastfeeding Lactation support   Santos Hardwick, CNM, MSN 03/13/2014, 8:42 AM

## 2014-03-13 NOTE — Plan of Care (Signed)
Problem: Phase I Progression Outcomes Goal: Voiding adequately Outcome: Completed/Met Date Met:  03/13/14 Goal: OOB as tolerated unless otherwise ordered Outcome: Completed/Met Date Met:  03/13/14

## 2014-03-14 LAB — GLUCOSE, CAPILLARY: Glucose-Capillary: 78 mg/dL (ref 70–99)

## 2014-03-14 MED ORDER — IBUPROFEN 600 MG PO TABS: 600.0000 mg | ORAL_TABLET | Freq: Four times a day (QID) | ORAL | Status: DC

## 2014-03-14 MED ORDER — OXYCODONE-ACETAMINOPHEN 5-325 MG PO TABS: 1.0000 | ORAL_TABLET | ORAL | Status: DC | PRN

## 2014-03-14 NOTE — Plan of Care (Signed)
Problem: Phase II Progression Outcomes Goal: Pain controlled on oral analgesia Outcome: Completed/Met Date Met:  03/14/14 Goal: Progress activity as tolerated unless otherwise ordered Outcome: Completed/Met Date Met:  03/14/14 Goal: Afebrile, VS remain stable Outcome: Completed/Met Date Met:  03/14/14 Goal: Tolerating diet Outcome: Completed/Met Date Met:  03/14/14  Problem: Discharge Progression Outcomes Goal: Barriers To Progression Addressed/Resolved Outcome: Not Applicable Date Met:  94/32/76 Goal: Activity appropriate for discharge plan Outcome: Completed/Met Date Met:  03/14/14 Goal: Tolerating diet Outcome: Completed/Met Date Met:  14/70/92 Goal: Complications resolved/controlled Outcome: Not Applicable Date Met:  95/74/73 Goal: Pain controlled with appropriate interventions Outcome: Completed/Met Date Met:  03/14/14 Goal: Afebrile, VS remain stable at discharge Outcome: Completed/Met Date Met:  03/14/14 Goal: Discharge plan in place and appropriate Outcome: Completed/Met Date Met:  03/14/14

## 2014-03-14 NOTE — Lactation Note (Signed)
This note was copied from the chart of Mariah Joyce. Lactation Consultation Note  Brief assist with latch.  Reviewed supply and demand with parents. Understanding verbalized. Patient Name: Mariah Trude McburneyHiba Lipsky ZOXWR'UToday's Date: 03/14/2014     Maternal Data    Feeding Feeding Type: Breast Fed Nipple Type: Slow - flow Length of feed: 5 min  LATCH Score/Interventions Latch: Grasps breast easily, tongue down, lips flanged, rhythmical sucking.  Audible Swallowing: A few with stimulation  Type of Nipple: Everted at rest and after stimulation  Comfort (Breast/Nipple): Soft / non-tender     Hold (Positioning): Assistance needed to correctly position infant at breast and maintain latch.  LATCH Score: 8  Lactation Tools Discussed/Used     Consult Status Consult Status: Complete    Soyla DryerJoseph, Gregg Winchell 03/14/2014, 1:13 PM

## 2014-03-16 NOTE — Progress Notes (Signed)
Ur chart review completed.  

## 2014-03-28 IMAGING — US US OB COMP LESS 14 WK
1 series · 14 of 28 positions shown · non-contrast
Comparison: None.

CLINICAL DATA: Vaginal bleeding.

EXAM:
TRANSVAGINAL OB ULTRASOUND; OBSTETRIC <14 WK ULTRASOUND
TECHNIQUE: Transvaginal ultrasound was performed for complete evaluation of the
gestation as well as the maternal uterus, adnexal regions, and
pelvic cul-de-sac.

[Series 1: us ob comp less 14 wks · 14 of 45 slices shown]
[im 2/45]
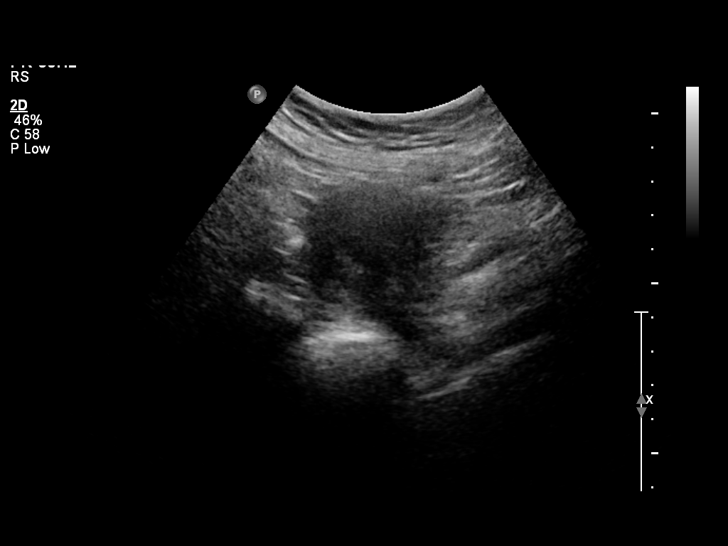
[im 5/45]
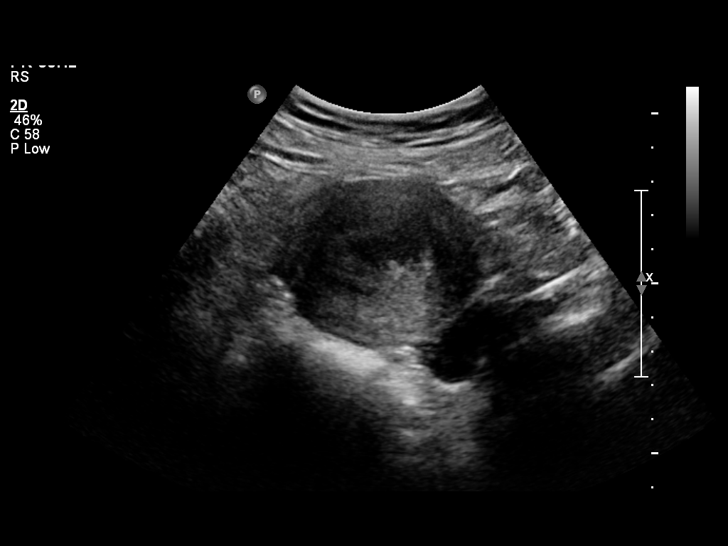
[im 9/45]
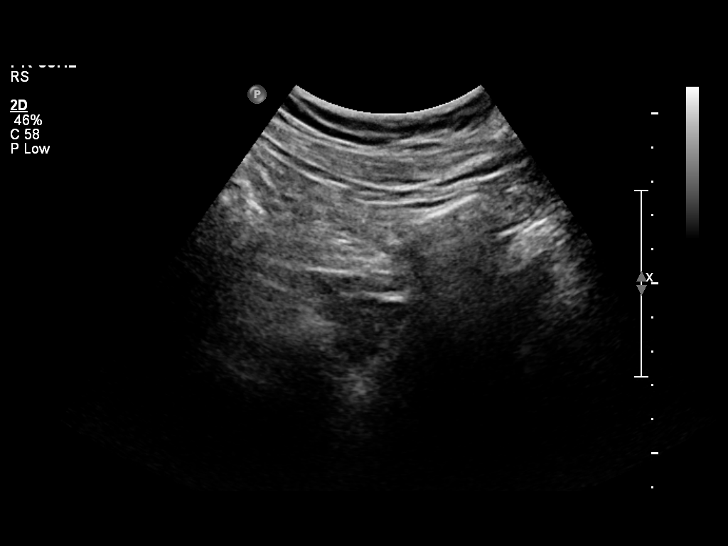
[im 12/45]
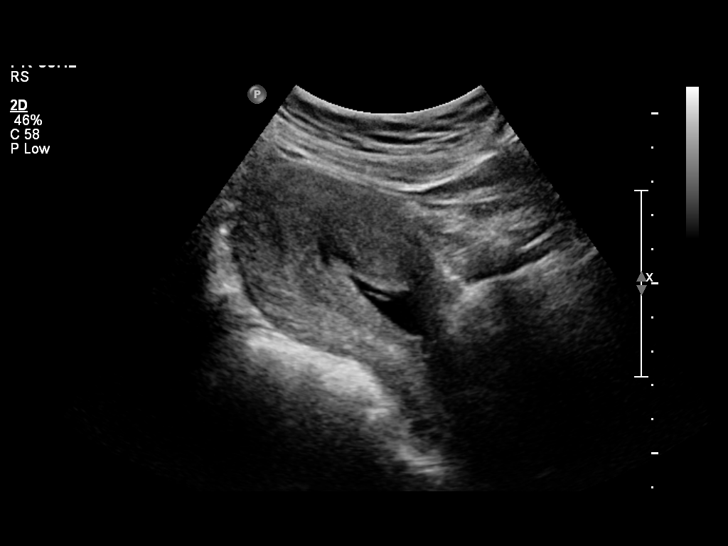
[im 15/45]
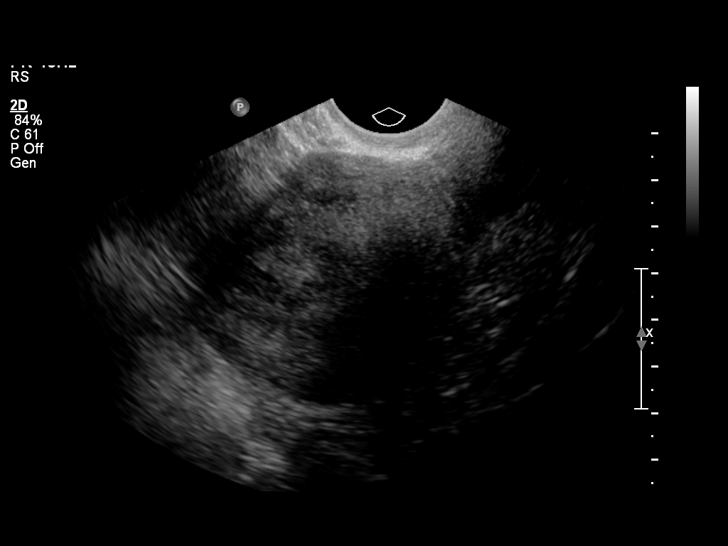
[im 18/45]
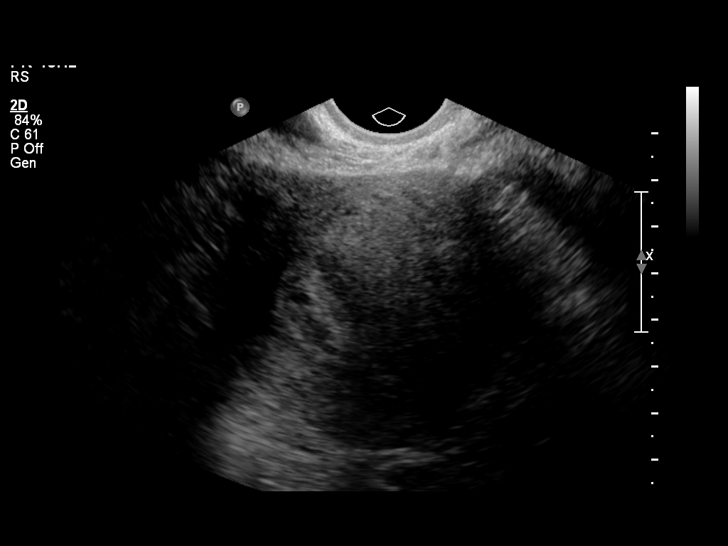
[im 22/45]
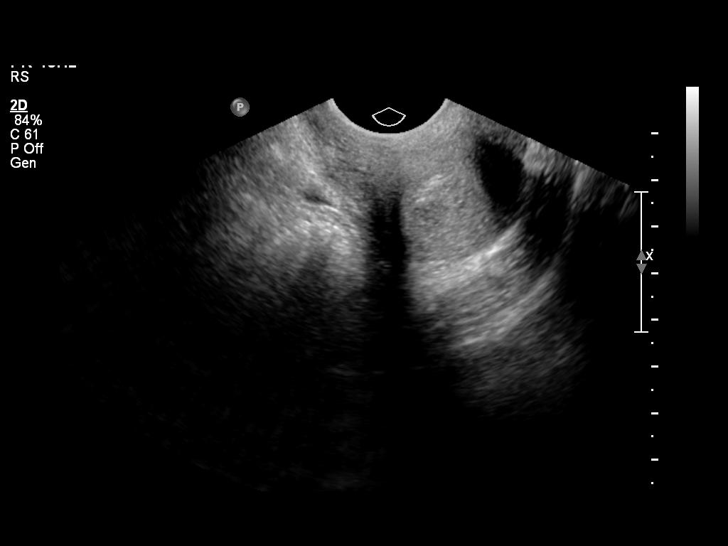
[im 25/45]
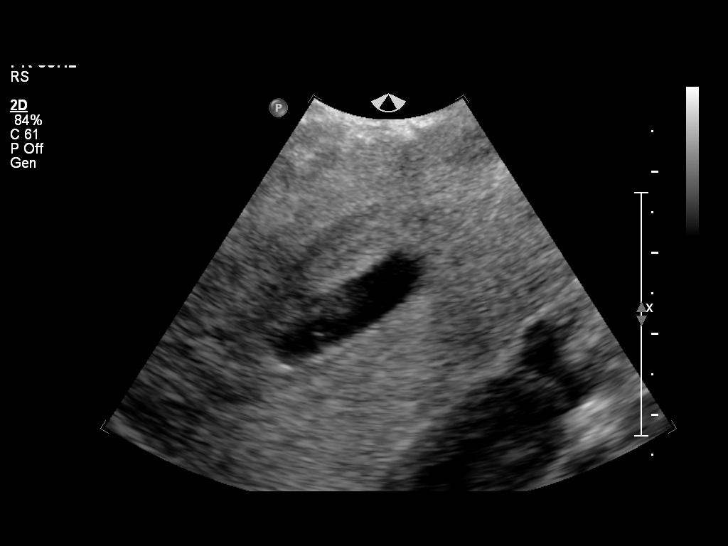
[im 28/45]
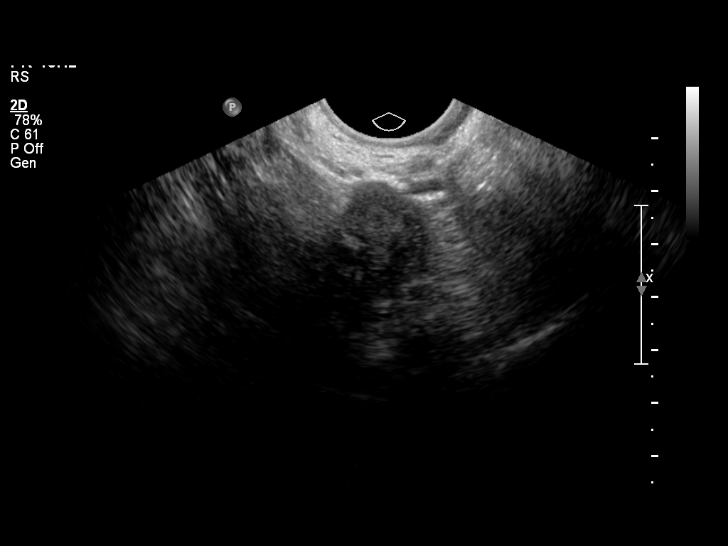
[im 31/45]
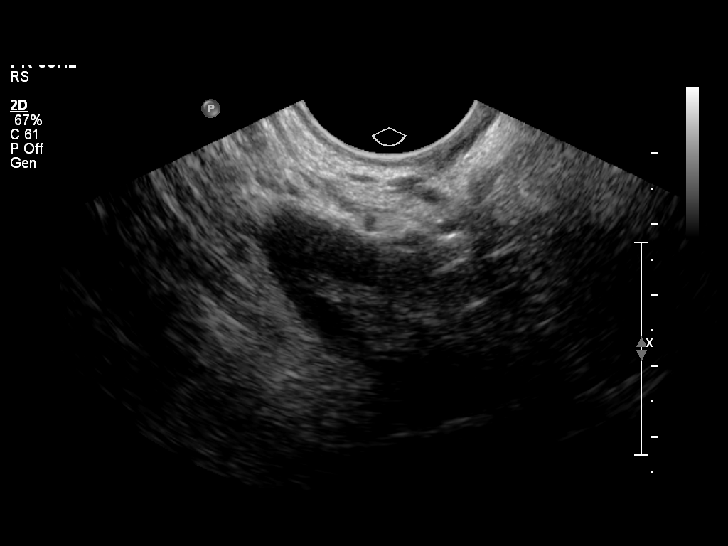
[im 35/45]
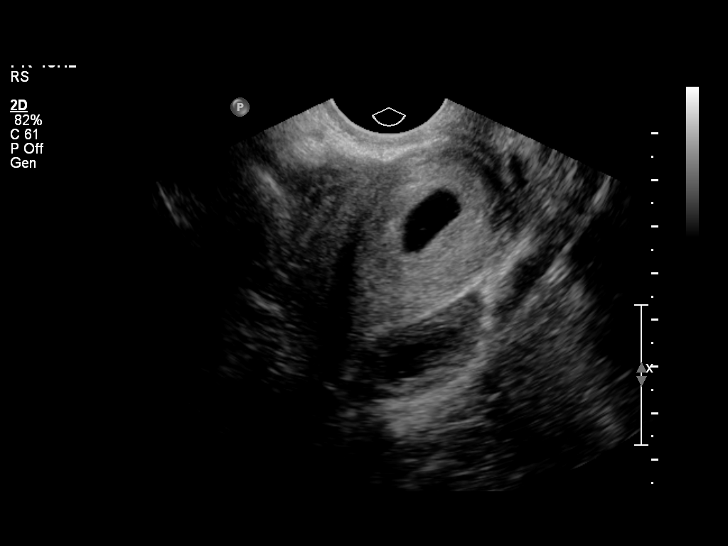
[im 38/45]
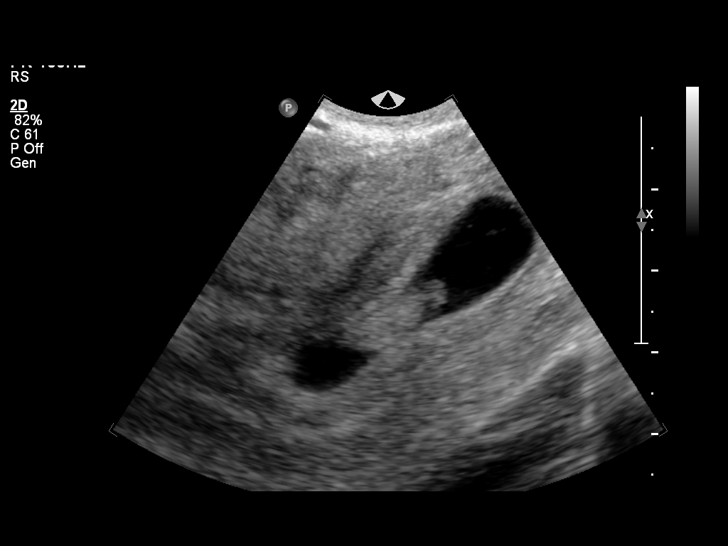
[im 41/45]
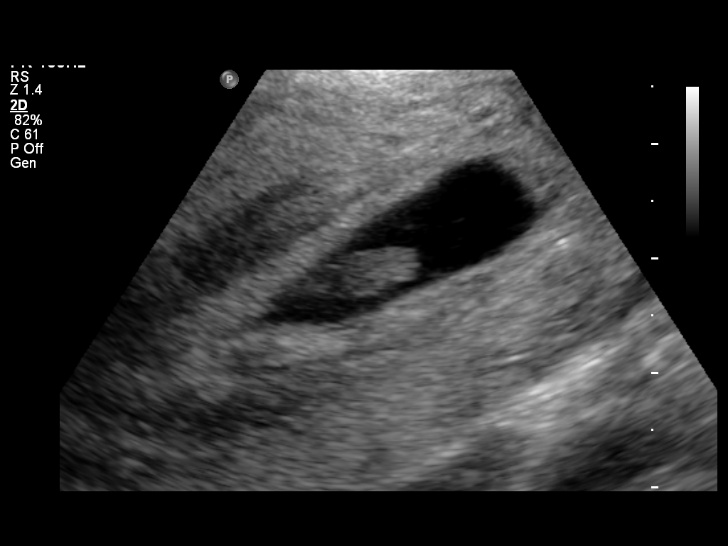
[im 45/45]
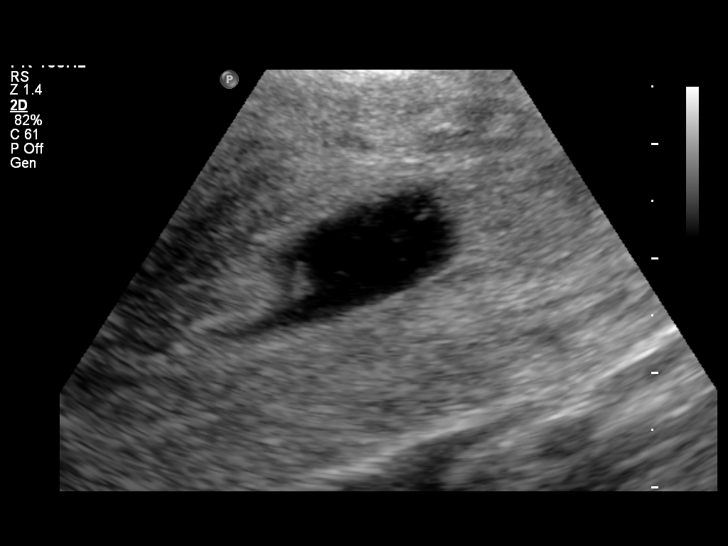

[14 of 28 positions shown; findings below may reference images not displayed]

FINDINGS: Intrauterine gestational sac: Visualized within the lower uterine
segment region of the uterus.

Yolk sac:  Question early yolk sac

Embryo:  Not visualize

Cardiac Activity: Not visualized

Heart Rate:  bpm

MSD: 16 mm  mm   6 w   4  d

US EDC: 11/03/2013

Maternal uterus/adnexae: Ovaries are symmetric and unremarkable. No
free fluid.
IMPRESSION: Early gestational sac within the lower uterine segment region. Given
its location, cannot exclude spontaneous abortion in progress. No
fetal pole visualized. Recommend clinical follow-up and followup
ultrasound in 10-14 days.

## 2015-08-23 ENCOUNTER — Emergency Department (HOSPITAL_COMMUNITY)
Admission: EM | Admit: 2015-08-23 | Discharge: 2015-08-23 | Disposition: A | Discharge status: Home / Self Care | Payer: Medicaid Other | Attending: Emergency Medicine | Admitting: Emergency Medicine

## 2015-08-23 ENCOUNTER — Encounter (HOSPITAL_COMMUNITY): Payer: Self-pay | Admitting: Emergency Medicine

## 2015-08-23 DIAGNOSIS — H6122 Impacted cerumen, left ear: Secondary | ICD-10-CM | POA: Diagnosis not present

## 2015-08-23 DIAGNOSIS — Z8632 Personal history of gestational diabetes: Secondary | ICD-10-CM | POA: Diagnosis not present

## 2015-08-23 DIAGNOSIS — H9202 Otalgia, left ear: Secondary | ICD-10-CM | POA: Diagnosis present

## 2015-08-23 DIAGNOSIS — Z79899 Other long term (current) drug therapy: Secondary | ICD-10-CM | POA: Insufficient documentation

## 2015-08-23 NOTE — Discharge Instructions (Signed)
Cerumen Impaction The structures of the external ear canal secrete a waxy substance known as cerumen. Excess cerumen can build up in the ear canal, causing a condition known as cerumen impaction. Cerumen impaction can cause ear pain and disrupt the function of the ear. The rate of cerumen production differs for each individual. In certain individuals, the configuration of the ear canal may decrease his or her ability to naturally remove cerumen. CAUSES Cerumen impaction is caused by excessive cerumen production or buildup. RISK FACTORS  Frequent use of swabs to clean ears.  Having narrow ear canals.  Having eczema.  Being dehydrated. SIGNS AND SYMPTOMS  Diminished hearing.  Ear drainage.  Ear pain.  Ear itch. TREATMENT Treatment may involve:  Over-the-counter or prescription ear drops to soften the cerumen.  Removal of cerumen by a health care provider. This may be done with:  Irrigation with warm water. This is the most common method of removal.  Ear curettes and other instruments.  Surgery. This may be done in severe cases. HOME CARE INSTRUCTIONS  Take medicines only as directed by your health care provider.  Do not insert objects into the ear with the intent of cleaning the ear. PREVENTION  Do not insert objects into the ear, even with the intent of cleaning the ear. Removing cerumen as a part of normal hygiene is not necessary, and the use of swabs in the ear canal is not recommended.  Drink enough water to keep your urine clear or pale yellow.  Control your eczema if you have it. SEEK MEDICAL CARE IF:  You develop ear pain.  You develop bleeding from the ear.  The cerumen does not clear after you use ear drops as directed.   This information is not intended to replace advice given to you by your health care provider. Make sure you discuss any questions you have with your health care provider.   Document Released: 05/18/2004 Document Revised: 05/01/2014  Document Reviewed: 11/25/2014 Elsevier Interactive Patient Education 2016 [employer] Inc.  

## 2015-08-23 NOTE — ED Provider Notes (Signed)
CSN: [id number]     Arrival date & time 08/23/15  4098 History  By signing my name below, I, Bethel Born, attest that this documentation has been prepared under the direction and in the presence of Yaseen Gilberg PA-C. Electronically Signed: Bethel Born, ED Scribe. 08/23/2015 10:48 AM    Chief Complaint  Patient presents with  . Ear Fullness    Patient is a 35 y.o. female presenting with ear pain. The history is provided by the patient. No language interpreter was used.  Otalgia Location:  Left Behind ear:  No abnormality Quality:  Pressure Severity:  Mild Duration:  1 day Timing:  Constant Progression:  Unchanged Context: foreign body (Q tips and sesame oil)   Context: not direct blow, not elevation change, not loud noise and no water in ear   Relieved by:  Nothing Ineffective treatments: Sesame oil. Associated symptoms: hearing loss (muffled )   Associated symptoms: no congestion, no cough, no ear discharge, no fever, no rhinorrhea and no sore throat    Mariah Joyce is a 35 y.o. female who presents to the Emergency Department complaining of new, constant, left ear fullness with onset yesterday. The pt felt as if something was in her ear yesterday and put sesame oil in the ear. Pt reports that she does clean the ears with Q-tips. She states that it feels as if the ear is "opening and closing". Associated symptoms include muffled hearing in the left ear and mild pressure-like pain. Pt denies fevers, nasal pain, congestion, rhinorrhea or any other complaints today.   Past Medical History  Diagnosis Date  . Medical history non-contributory   . Complication of anesthesia   . GDM (gestational diabetes mellitus)    Past Surgical History  Procedure Laterality Date  . Cesarean section      for non-reassuring fhr   History reviewed. No pertinent family history. Social History  Substance Use Topics  . Smoking status: Never Smoker   . Smokeless tobacco: Never Used   . Alcohol Use: No   OB History    Gravida Para Term Preterm AB TAB SAB Ectopic Multiple Living   0 1 0 1 0 0 3     Review of Systems  Constitutional: Negative for fever.  HENT: Positive for ear pain and hearing loss (muffled ). Negative for congestion, ear discharge, rhinorrhea and sore throat.        Left ear fullness   Respiratory: Negative for cough.   All other systems reviewed and are negative.   Allergies  Review of patient's allergies indicates no known allergies.  Home Medications   Prior to Admission medications   Medication Sig Start Date End Date Taking? Authorizing Provider  ibuprofen (ADVIL,MOTRIN) 600 MG tablet Take 1 tablet (600 mg total) by mouth every 6 (six) hours. 03/14/14   Venus Standard, CNM  oxyCODONE-acetaminophen (PERCOCET/ROXICET) 5-325 MG per tablet Take 1 tablet by mouth every 4 (four) hours as needed (for pain scale less than 7). 03/14/14   Venus Standard, CNM  prenatal vitamin w/FE, FA (PRENATAL 1 + 1) 27-1 MG TABS tablet Take 1 tablet by mouth daily. 07/18/13   Deirdre C Poe, CNM   BP 115/75 mmHg  Pulse 78  Temp(Src) 98.9 F (37.2 C) (Oral)  Resp 18  SpO2 100% Physical Exam  Constitutional: She appears well-developed and well-nourished. No distress.  HENT:  Head: Normocephalic and atraumatic.  Right Ear: Tympanic membrane and external ear normal. No swelling. Tympanic membrane is not  injected.  Left Ear: External ear normal. No drainage or swelling. A foreign body (cerumen impaction) is present. No mastoid tenderness. Tympanic membrane is not injected.  Significant cerumen impaction noted to left ear canal. Right canal without swelling or erythema. TM pearly gray with good visualization of landmarks.   Eyes: Conjunctivae are normal. Right eye exhibits no discharge. Left eye exhibits no discharge. No scleral icterus.  Neck: Normal range of motion.  Cardiovascular: Normal rate.   Pulmonary/Chest: Effort normal.  Musculoskeletal: Normal  range of motion.  Moves all extremities spontaneously  Neurological: She is alert. Coordination normal.  Skin: Skin is warm and dry.  Psychiatric: She has a normal mood and affect. Her behavior is normal.  Nursing note and vitals reviewed.   ED Course  Procedures (including critical care time) DIAGNOSTIC STUDIES: Oxygen Saturation is 100% on RA,  normal by my interpretation.    COORDINATION OF CARE: 10:49 AM Discussed treatment plan which includes right er irrigation with pt at bedside and pt agreed to plan.  Labs Review Labs Reviewed - No data to display  Imaging Review No results found.   EKG Interpretation None      MDM   Final diagnoses:  Cerumen impaction, left   35 year old female presenting with muffled hearing and ear fullness x 1 day. Afebrile and nontoxic appearing. Cerumen impaction noted in left ear canal. Irrigated ear with large amount of cerumen removed. On re-examination, impaction removed and good visualization of TM without erythema or signs of infection. Discussed proper home ear care and discouraged use of Q tips. Return precautions given in discharge paperwork and discussed with pt at bedside. Pt stable for discharge  I personally performed the services described in this documentation, which was scribed in my presence. The recorded information has been reviewed and is accurate.    Alveta HeimlichStevi Khayree Delellis, PA-C 08/23/15 1152  Benjiman CoreNathan Pickering, MD 08/23/15 [phone]

## 2015-08-23 NOTE — ED Notes (Signed)
Pt here with fullness feeling in left ear just started yesterday

## 2016-03-14 ENCOUNTER — Ambulatory Visit (INDEPENDENT_AMBULATORY_CARE_PROVIDER_SITE_OTHER): Payer: Medicaid Other | Admitting: Certified Nurse Midwife

## 2016-03-14 VITALS — BP 118/80 | HR 85 | Wt 160.0 lb

## 2016-03-14 DIAGNOSIS — Z539 Procedure and treatment not carried out, unspecified reason: Secondary | ICD-10-CM

## 2016-03-15 NOTE — Progress Notes (Signed)
No exam, needs annual exam then contraception management.

## 2016-04-13 ENCOUNTER — Ambulatory Visit (INDEPENDENT_AMBULATORY_CARE_PROVIDER_SITE_OTHER): Payer: Medicaid Other | Admitting: Certified Nurse Midwife

## 2016-04-13 ENCOUNTER — Other Ambulatory Visit (HOSPITAL_COMMUNITY)
Admission: RE | Admit: 2016-04-13 | Discharge: 2016-04-13 | Disposition: A | Discharge status: Home / Self Care | Payer: Medicaid Other | Source: Ambulatory Visit | Attending: Certified Nurse Midwife | Admitting: Certified Nurse Midwife

## 2016-04-13 ENCOUNTER — Encounter: Payer: Self-pay | Admitting: Certified Nurse Midwife

## 2016-04-13 VITALS — BP 109/72 | HR 79 | Resp 16 | Wt 155.0 lb

## 2016-04-13 DIAGNOSIS — Z01411 Encounter for gynecological examination (general) (routine) with abnormal findings: Secondary | ICD-10-CM | POA: Diagnosis present

## 2016-04-13 DIAGNOSIS — Z1151 Encounter for screening for human papillomavirus (HPV): Secondary | ICD-10-CM | POA: Diagnosis present

## 2016-04-13 DIAGNOSIS — Z8632 Personal history of gestational diabetes: Secondary | ICD-10-CM

## 2016-04-13 DIAGNOSIS — Z0000 Encounter for general adult medical examination without abnormal findings: Secondary | ICD-10-CM

## 2016-04-13 DIAGNOSIS — Z01419 Encounter for gynecological examination (general) (routine) without abnormal findings: Secondary | ICD-10-CM | POA: Diagnosis not present

## 2016-04-13 NOTE — Progress Notes (Signed)
Subjective:        Mariah Joyce is a 35 y.o. female here for a routine exam.  Current complaints: desires pregnancy, wants Nexplanon removed.  States in frequent periods.  Has had nexplanon for 2 years.   Had VBAC last delivery, Hx of GDM with last delivery.   Personal health questionnaire:  Is patient Ashkenazi Jewish, have a family history of breast and/or ovarian cancer: no Is there a family history of uterine cancer diagnosed at age < 3950, gastrointestinal cancer, urinary tract cancer, family member who is a Personnel officerLynch syndrome-associated carrier: no Is the patient overweight and hypertensive, family history of diabetes, personal history of gestational diabetes, preeclampsia or PCOS: no Is patient over 4155, have PCOS,  family history of premature CHD under age 34, diabetes, smoke, have hypertension or peripheral artery disease:  no At any time, has a partner hit, kicked or otherwise hurt or frightened you?: no Over the past 2 weeks, have you felt down, depressed or hopeless?: no Over the past 2 weeks, have you felt little interest or pleasure in doing things?:no   Gynecologic History Patient's last menstrual period was 03/29/2016. Contraception: Nexplanon Last Pap: unknown. Results were: normal according to the patient. Last mammogram: n/a.  Obstetric History OB History  Gravida Para Term Preterm AB Living  4 3 3  0 1 3  SAB TAB Ectopic Multiple Live Births  1 0 0 0 3    # Outcome Date GA Lbr Len/2nd Weight Sex Delivery Anes PTL Lv  4 Term 03/12/14 4113w1d / 00:52 6 lb 14.8 oz (3.14 kg) M VBAC EPI  LIV  3 SAB 2014          2 Term 2011 479w0d  6 lb 8 oz (2.948 kg) M CS-LTranv Spinal  LIV  1 Term 2008 259w0d  7 lb (3.175 kg) F Vag-Spont   LIV      Past Medical History:  Diagnosis Date  . Complication of anesthesia   . GDM (gestational diabetes mellitus)   . Medical history non-contributory     Past Surgical History:  Procedure Laterality Date  . CESAREAN SECTION      for non-reassuring fhr     Current Outpatient Prescriptions:  .  etonogestrel (NEXPLANON) 68 MG IMPL implant, 1 each by Subdermal route once., Disp: , Rfl:  .  ibuprofen (ADVIL,MOTRIN) 600 MG tablet, Take 1 tablet (600 mg total) by mouth every 6 (six) hours. (Patient not taking: Reported on 04/13/2016), Disp: 30 tablet, Rfl: 0 .  oxyCODONE-acetaminophen (PERCOCET/ROXICET) 5-325 MG per tablet, Take 1 tablet by mouth every 4 (four) hours as needed (for pain scale less than 7). (Patient not taking: Reported on 04/13/2016), Disp: 30 tablet, Rfl: 0 .  prenatal vitamin w/FE, FA (PRENATAL 1 + 1) 27-1 MG TABS tablet, Take 1 tablet by mouth daily. (Patient not taking: Reported on 04/13/2016), Disp: 30 each, Rfl: 0 No Known Allergies  Social History  Substance Use Topics  . Smoking status: Never Smoker  . Smokeless tobacco: Never Used  . Alcohol use No    History reviewed. No pertinent family history.    Review of Systems  Constitutional: negative for fatigue and weight loss Respiratory: negative for cough and wheezing Cardiovascular: negative for chest pain, fatigue and palpitations Gastrointestinal: negative for abdominal pain and change in bowel habits Musculoskeletal:negative for myalgias Neurological: negative for gait problems and tremors Behavioral/Psych: negative for abusive relationship, depression Endocrine: negative for temperature intolerance    Genitourinary:negative for abnormal menstrual periods,  genital lesions, hot flashes, sexual problems and vaginal discharge Integument/breast: negative for breast lump, breast tenderness, nipple discharge and skin lesion(s)    Objective:       BP 109/72   Pulse 79   Resp 16   Wt 155 lb (70.3 kg)   LMP 03/29/2016 Comment: irregular bleeding with BC  BMI 26.61 kg/m  General:   alert  Skin:   no rash or abnormalities  Lungs:   clear to auscultation bilaterally  Heart:   regular rate and rhythm, S1, S2 normal, no murmur, click,  rub or gallop  Breasts:   normal without suspicious masses, skin or nipple changes or axillary nodes  Abdomen:  normal findings: no organomegaly, soft, non-tender and no hernia  Pelvis:  External genitalia: normal general appearance, female circumcision class 3.  Urinary system: urethral meatus normal and bladder without fullness, nontender Vaginal: normal without tenderness, induration or masses Cervix: normal appearance Adnexa: normal bimanual exam Uterus: anteverted and non-tender, normal size   Lab Review Urine pregnancy test Labs reviewed yes Radiologic studies reviewed no  50% of 30 min visit spent on counseling and coordination of care.    Assessment:    Healthy female exam.   Contraception management  Preconception counseling  H/O VBAC  H/O GDM  Female circumcision Plan:    Education reviewed: calcium supplements, depression evaluation, low fat, low cholesterol diet, safe sex/STD prevention, self breast exams, skin cancer screening and weight bearing exercise. Contraception: Nexplanon. Follow up in: 1 year.   Meds ordered this encounter  Medications  . etonogestrel (NEXPLANON) 68 MG IMPL implant    Sig: 1 each by Subdermal route once.   Orders Placed This Encounter  Procedures  . NuSwab Vaginitis (VG)   Need to obtain previous records  Follow up with Nexplanon removal.

## 2016-04-13 NOTE — Progress Notes (Signed)
Pt presents for AEX. She is planning to conceive and is requesting to remove Implant today. It will be 2 yrs in Jan since she's had the implant.

## 2016-04-19 LAB — CYTOLOGY - PAP
DIAGNOSIS: NEGATIVE
HPV: NOT DETECTED

## 2016-04-20 LAB — NUSWAB VAGINITIS (VG)
CANDIDA GLABRATA, NAA: NEGATIVE
Candida albicans, NAA: NEGATIVE
TRICH VAG BY NAA: NEGATIVE

## 2016-05-04 ENCOUNTER — Ambulatory Visit (INDEPENDENT_AMBULATORY_CARE_PROVIDER_SITE_OTHER): Payer: Medicaid Other | Admitting: Obstetrics & Gynecology

## 2016-05-04 VITALS — BP 122/86 | HR 85 | Wt 157.0 lb

## 2016-05-04 DIAGNOSIS — Z3046 Encounter for surveillance of implantable subdermal contraceptive: Secondary | ICD-10-CM

## 2016-05-04 NOTE — Progress Notes (Signed)
Patient is in the office for nexplanon removal. 

## 2016-05-04 NOTE — Progress Notes (Signed)
Patient ID: Erik ObeyHiba Eltayeb Swayne, female   DOB: 01/10/1981, 36 y.o.   MRN: [id number] GYNECOLOGY OFFICE PROCEDURE NOTE  Haleemah Julieta Guttingltayeb Newborn is a 36 y.o. [id number] here for Nexplanon removal.  Last pap smear was on 04/13/16 and was normal.  No other gynecologic concerns.    Nexplanon Removal Patient identified, informed consent performed, consent signed.   Appropriate time out taken. Nexplanon site identified on right arm.  Area prepped in usual sterile fashon. One ml of 1% lidocaine was used to anesthetize the area at the distal end of the implant. A small stab incision was made right beside the implant on the distal portion.  The Nexplanon rod was grasped using hemostats and removed without difficulty.  There was minimal blood loss. There were no complications. A pressure bandage was applied to reduce any bruising.  The patient tolerated the procedure well and was given post procedure instructions.  Patient is planning to attempt conception.     Adam PhenixJames G Arnold, MD Attending Obstetrician & Gynecologist, [facility]

## 2016-05-04 NOTE — Patient Instructions (Signed)
Etonogestrel implant °What is this medicine? °ETONOGESTREL (et oh noe JES trel) is a contraceptive (birth control) device. It is used to prevent pregnancy. It can be used for up to 3 years. °COMMON BRAND NAME(S): Implanon, Nexplanon °What should I tell my health care provider before I take this medicine? °They need to know if you have any of these conditions: °-abnormal vaginal bleeding °-blood vessel disease or blood clots °-cancer of the breast, cervix, or liver °-depression °-diabetes °-gallbladder disease °-headaches °-heart disease or recent heart attack °-high blood pressure °-high cholesterol °-kidney disease °-liver disease °-renal disease °-seizures °-tobacco smoker °-an unusual or allergic reaction to etonogestrel, other hormones, anesthetics or antiseptics, medicines, foods, dyes, or preservatives °-pregnant or trying to get pregnant °-breast-feeding °How should I use this medicine? °This device is inserted just under the skin on the inner side of your upper arm by a health care professional. °Talk to your pediatrician regarding the use of this medicine in children. Special care may be needed. °What if I miss a dose? °This does not apply. °What may interact with this medicine? °Do not take this medicine with any of the following medications: °-amprenavir °-bosentan °-fosamprenavir °This medicine may also interact with the following medications: °-barbiturate medicines for inducing sleep or treating seizures °-certain medicines for fungal infections like ketoconazole and itraconazole °-griseofulvin °-medicines to treat seizures like carbamazepine, felbamate, oxcarbazepine, phenytoin, topiramate °-modafinil °-phenylbutazone °-rifampin °-some medicines to treat HIV infection like atazanavir, indinavir, lopinavir, nelfinavir, tipranavir, ritonavir °-St. John's wort °What should I watch for while using this medicine? °This product does not protect you against HIV infection (AIDS) or other sexually transmitted  diseases. °You should be able to feel the implant by pressing your fingertips over the skin where it was inserted. Contact your doctor if you cannot feel the implant, and use a non-hormonal birth control method (such as condoms) until your doctor confirms that the implant is in place. If you feel that the implant may have broken or become bent while in your arm, contact your healthcare provider. °What side effects may I notice from receiving this medicine? °Side effects that you should report to your doctor or health care professional as soon as possible: °-allergic reactions like skin rash, itching or hives, swelling of the face, lips, or tongue °-breast lumps °-changes in emotions or moods °-depressed mood °-heavy or prolonged menstrual bleeding °-pain, irritation, swelling, or bruising at the insertion site °-scar at site of insertion °-signs of infection at the insertion site such as fever, and skin redness, pain or discharge °-signs of pregnancy °-signs and symptoms of a blood clot such as breathing problems; changes in vision; chest pain; severe, sudden headache; pain, swelling, warmth in the leg; trouble speaking; sudden numbness or weakness of the face, arm or leg °-signs and symptoms of liver injury like dark yellow or brown urine; general ill feeling or flu-like symptoms; light-colored stools; loss of appetite; nausea; right upper belly pain; unusually weak or tired; yellowing of the eyes or skin °-unusual vaginal bleeding, discharge °-signs and symptoms of a stroke like changes in vision; confusion; trouble speaking or understanding; severe headaches; sudden numbness or weakness of the face, arm or leg; trouble walking; dizziness; loss of balance or coordination °Side effects that usually do not require medical attention (report to your doctor or health care professional if they continue or are bothersome): °-acne °-back pain °-breast pain °-changes in weight °-dizziness °-general ill feeling or flu-like  symptoms °-headache °-irregular menstrual bleeding °-nausea °-sore throat °-vaginal   irritation or inflammation °Where should I keep my medicine? °This drug is given in a hospital or clinic and will not be stored at home. °© 2017 [employer]/Gold Standard (2015-05-13 10:56:20) ° °

## 2016-05-09 ENCOUNTER — Encounter: Payer: Self-pay | Admitting: *Deleted

## 2016-06-15 ENCOUNTER — Encounter (HOSPITAL_COMMUNITY): Payer: Self-pay

## 2016-06-15 ENCOUNTER — Emergency Department (HOSPITAL_COMMUNITY): Payer: Medicaid Other

## 2016-06-15 ENCOUNTER — Observation Stay (HOSPITAL_COMMUNITY)
Admission: EM | Admit: 2016-06-15 | Discharge: 2016-06-16 | Disposition: A | Discharge status: Home / Self Care | Payer: Medicaid Other | Attending: Oncology | Admitting: Oncology

## 2016-06-15 DIAGNOSIS — E872 Acidosis: Secondary | ICD-10-CM | POA: Insufficient documentation

## 2016-06-15 DIAGNOSIS — J209 Acute bronchitis, unspecified: Secondary | ICD-10-CM | POA: Diagnosis not present

## 2016-06-15 DIAGNOSIS — J129 Viral pneumonia, unspecified: Principal | ICD-10-CM | POA: Insufficient documentation

## 2016-06-15 DIAGNOSIS — Z8632 Personal history of gestational diabetes: Secondary | ICD-10-CM | POA: Diagnosis not present

## 2016-06-15 DIAGNOSIS — R740 Nonspecific elevation of levels of transaminase and lactic acid dehydrogenase [LDH]: Secondary | ICD-10-CM

## 2016-06-15 DIAGNOSIS — J189 Pneumonia, unspecified organism: Secondary | ICD-10-CM | POA: Diagnosis present

## 2016-06-15 DIAGNOSIS — B348 Other viral infections of unspecified site: Secondary | ICD-10-CM | POA: Insufficient documentation

## 2016-06-15 LAB — CBC WITH DIFFERENTIAL/PLATELET
BASOS ABS: 0 10*3/uL (ref 0.0–0.1)
BASOS PCT: 0 %
EOS PCT: 1 %
Eosinophils Absolute: 0.1 10*3/uL (ref 0.0–0.7)
HCT: 41.6 % (ref 36.0–46.0)
Hemoglobin: 14.2 g/dL (ref 12.0–15.0)
LYMPHS PCT: 7 %
Lymphs Abs: 0.6 10*3/uL — ABNORMAL LOW (ref 0.7–4.0)
MCH: 29.1 pg (ref 26.0–34.0)
MCHC: 34.1 g/dL (ref 30.0–36.0)
MCV: 85.2 fL (ref 78.0–100.0)
Monocytes Absolute: 0.4 10*3/uL (ref 0.1–1.0)
Monocytes Relative: 4 %
NEUTROS ABS: 8.5 10*3/uL — AB (ref 1.7–7.7)
Neutrophils Relative %: 88 %
PLATELETS: 180 10*3/uL (ref 150–400)
RBC: 4.88 MIL/uL (ref 3.87–5.11)
RDW: 12.1 % (ref 11.5–15.5)
WBC: 9.6 10*3/uL (ref 4.0–10.5)

## 2016-06-15 LAB — COMPREHENSIVE METABOLIC PANEL
ALK PHOS: 63 U/L (ref 38–126)
ALT: 17 U/L (ref 14–54)
ANION GAP: 12 (ref 5–15)
AST: 27 U/L (ref 15–41)
Albumin: 4.1 g/dL (ref 3.5–5.0)
BILIRUBIN TOTAL: 0.7 mg/dL (ref 0.3–1.2)
BUN: 5 mg/dL — AB (ref 6–20)
CALCIUM: 9.4 mg/dL (ref 8.9–10.3)
CO2: 21 mmol/L — ABNORMAL LOW (ref 22–32)
Chloride: 103 mmol/L (ref 101–111)
Creatinine, Ser: 0.71 mg/dL (ref 0.44–1.00)
GFR calc Af Amer: >60 mL/min (ref >60)
Glucose, Bld: 146 mg/dL — ABNORMAL HIGH (ref 65–99)
Potassium: 3.4 mmol/L — ABNORMAL LOW (ref 3.5–5.1)
Sodium: 136 mmol/L (ref 135–145)
TOTAL PROTEIN: 7.6 g/dL (ref 6.5–8.1)

## 2016-06-15 LAB — LACTIC ACID, PLASMA: Lactic Acid, Venous: 1.6 mmol/L (ref 0.5–1.9)

## 2016-06-15 LAB — PROCALCITONIN: Procalcitonin: <0.1 ng/mL

## 2016-06-15 LAB — I-STAT CG4 LACTIC ACID, ED
LACTIC ACID, VENOUS: 2.81 mmol/L — AB (ref 0.5–1.9)
Lactic Acid, Venous: 3.22 mmol/L (ref 0.5–1.9)

## 2016-06-15 LAB — INFLUENZA PANEL BY PCR (TYPE A & B)
INFLBPCR: NEGATIVE
Influenza A By PCR: NEGATIVE

## 2016-06-15 MED ORDER — IBUPROFEN 400 MG PO TABS
600.0000 mg | ORAL_TABLET | Freq: Once | ORAL | Status: AC
  Administered 2016-06-15: 600 mg via ORAL
  Filled 2016-06-15: qty 1

## 2016-06-15 MED ORDER — AZITHROMYCIN 500 MG PO TABS
500.0000 mg | ORAL_TABLET | Freq: Every day | ORAL | Status: DC
  Administered 2016-06-16: 500 mg via ORAL
  Filled 2016-06-15: qty 1

## 2016-06-15 MED ORDER — ACETAMINOPHEN 325 MG PO TABS
650.0000 mg | ORAL_TABLET | Freq: Once | ORAL | Status: AC
  Administered 2016-06-15: 650 mg via ORAL

## 2016-06-15 MED ORDER — POTASSIUM CHLORIDE CRYS ER 20 MEQ PO TBCR
20.0000 meq | EXTENDED_RELEASE_TABLET | Freq: Once | ORAL | Status: AC
  Administered 2016-06-15: 20 meq via ORAL
  Filled 2016-06-15: qty 1

## 2016-06-15 MED ORDER — ALBUTEROL SULFATE (2.5 MG/3ML) 0.083% IN NEBU
5.0000 mg | INHALATION_SOLUTION | Freq: Once | RESPIRATORY_TRACT | Status: AC
Start: 2016-06-15 — End: 2016-06-15
  Administered 2016-06-15: 5 mg via RESPIRATORY_TRACT

## 2016-06-15 MED ORDER — ACETAMINOPHEN 650 MG RE SUPP: 650.0000 mg | Freq: Four times a day (QID) | RECTAL | Status: DC | PRN

## 2016-06-15 MED ORDER — GUAIFENESIN-DM 100-10 MG/5ML PO SYRP
5.0000 mL | ORAL_SOLUTION | ORAL | Status: DC | PRN
  Administered 2016-06-16: 5 mL via ORAL
  Filled 2016-06-15: qty 5

## 2016-06-15 MED ORDER — SODIUM CHLORIDE 0.9 % IV SOLN
INTRAVENOUS | Status: AC
  Administered 2016-06-15: 23:00:00 via INTRAVENOUS

## 2016-06-15 MED ORDER — PNEUMOCOCCAL VAC POLYVALENT 25 MCG/0.5ML IJ INJ
0.5000 mL | INJECTION | INTRAMUSCULAR | Status: DC
  Filled 2016-06-15: qty 0.5

## 2016-06-15 MED ORDER — SENNOSIDES-DOCUSATE SODIUM 8.6-50 MG PO TABS: 1.0000 | ORAL_TABLET | Freq: Every day | ORAL | Status: DC

## 2016-06-15 MED ORDER — DEXTROSE 5 % IV SOLN
1.0000 g | Freq: Once | INTRAVENOUS | Status: AC
  Administered 2016-06-15: 1 g via INTRAVENOUS
  Filled 2016-06-15: qty 10

## 2016-06-15 MED ORDER — ACETAMINOPHEN 325 MG PO TABS: 650.0000 mg | ORAL_TABLET | Freq: Four times a day (QID) | ORAL | Status: DC | PRN

## 2016-06-15 MED ORDER — ALBUTEROL SULFATE (2.5 MG/3ML) 0.083% IN NEBU
INHALATION_SOLUTION | RESPIRATORY_TRACT | Status: AC
  Filled 2016-06-15: qty 6

## 2016-06-15 MED ORDER — ORAL CARE MOUTH RINSE
15.0000 mL | Freq: Two times a day (BID) | OROMUCOSAL | Status: DC
  Administered 2016-06-15 – 2016-06-16 (×2): 15 mL via OROMUCOSAL

## 2016-06-15 MED ORDER — PROMETHAZINE HCL 25 MG PO TABS: 12.5000 mg | ORAL_TABLET | Freq: Four times a day (QID) | ORAL | Status: DC | PRN

## 2016-06-15 MED ORDER — SODIUM CHLORIDE 0.9% FLUSH
3.0000 mL | Freq: Two times a day (BID) | INTRAVENOUS | Status: DC
  Administered 2016-06-15: 3 mL via INTRAVENOUS

## 2016-06-15 MED ORDER — ACETAMINOPHEN 325 MG PO TABS
ORAL_TABLET | ORAL | Status: AC
  Filled 2016-06-15: qty 2

## 2016-06-15 MED ORDER — SODIUM CHLORIDE 0.9 % IV BOLUS (SEPSIS)
2000.0000 mL | Freq: Once | INTRAVENOUS | Status: AC
  Administered 2016-06-15: 2000 mL via INTRAVENOUS

## 2016-06-15 MED ORDER — HEPARIN SODIUM (PORCINE) 5000 UNIT/ML IJ SOLN
5000.0000 [IU] | Freq: Three times a day (TID) | INTRAMUSCULAR | Status: DC
  Administered 2016-06-15 – 2016-06-16 (×2): 5000 [IU] via SUBCUTANEOUS
  Filled 2016-06-15 (×2): qty 1

## 2016-06-15 MED ORDER — IPRATROPIUM-ALBUTEROL 0.5-2.5 (3) MG/3ML IN SOLN
3.0000 mL | RESPIRATORY_TRACT | Status: DC | PRN
  Administered 2016-06-15 – 2016-06-16 (×2): 3 mL via RESPIRATORY_TRACT
  Filled 2016-06-15 (×2): qty 3

## 2016-06-15 MED ORDER — DEXTROSE 5 % IV SOLN
500.0000 mg | Freq: Once | INTRAVENOUS | Status: AC
  Administered 2016-06-15: 500 mg via INTRAVENOUS
  Filled 2016-06-15: qty 500

## 2016-06-15 NOTE — H&P (Signed)
Date: 06/15/2016               Patient Name:  Mariah Joyce MRN: [id number]  DOB: 04/15/1981 Age / Sex: 36 y.o., female   PCP: No Pcp Per Patient         Medical Service: Internal Medicine Teaching Service         Attending Physician: Dr. Levert FeinsteinJames M Granfortuna, MD    First Contact: Dr. Nyra MarketGorica Svalina Pager: [phone]  Second Contact: Dr. Valentino NoseNathan Boswell Pager: [phone]       After Hours (After 5p /  First Contact Pager: [phone]  Weekends / Holidays): Second Contact Pager: [phone]   Chief Complaint: cough and fever  History of Present Illness: Mariah Joyce is a 36 y.o. female with a h/o of GDM who presents with 2 day h/o cough and URI sx.  Pt reports sudden onset of SOB this morning. This occurred in the setting of worsening "cold" symptoms w/ cough, rhinorrhea, and subjective fevers which began 1 day prior. Pt has not taken any OTC medications. She reports that her family has had the "flu" recently 2 wks ago. She denies any CP, N/V/D, urinary sx, myalgias, chills. No sore throat, sinus pain, sinus congestion, or headaches. Pt denies any h/o asthma. No recent travel.  In the ED, she was found to have borderline temperature to 99, tachypnea, tachycardia, but normal O2 sats. Her CXR was concerning for L perihilar infiltrate. She was found to have a LA of 3.22 trended to 2.8 w/ fluids. Negative influenza. She received a 2L NS bolus and was started on CAP tx w/ CTX/Azithro.  Meds: No home medications.  Allergies: Allergies as of 06/15/2016  . (No Known Allergies)   Past Medical History:  Diagnosis Date  . Complication of anesthesia   . GDM (gestational diabetes mellitus)   . Medical history non-contributory    Family History: Pt has no family h/o DM or pulmonary dz.  Social History: Pt  reports that she has never smoked. She has never used smokeless tobacco. She reports that she does not drink alcohol or use drugs.  Review of Systems: A complete  ROS was negative except as per HPI. Review of Systems  Constitutional: Positive for fever. Negative for chills and weight loss.  HENT: Negative for congestion, sinus pain and sore throat.   Eyes: Negative for blurred vision.  Respiratory: Positive for cough and shortness of breath. Negative for sputum production.   Cardiovascular: Negative for chest pain and leg swelling.  Gastrointestinal: Negative for abdominal pain, constipation, diarrhea, nausea and vomiting.  Genitourinary: Negative for dysuria, frequency and urgency.  Musculoskeletal: Negative for myalgias.  Skin: Negative for rash.  Neurological: Negative for dizziness, tremors and headaches.  Endo/Heme/Allergies: Negative for polydipsia.  Psychiatric/Behavioral: The patient is not nervous/anxious.    Physical Exam: Vitals:   06/15/16 1945 06/15/16 2030 06/15/16 2045 06/15/16 2146  BP: 102/59 114/64 110/76 122/79  Pulse: 108 107 108 (!) 115  Resp: (!) 39 (!) 41 (!) 37 18  Temp:    99.5 F (37.5 C)  TempSrc:    Oral  SpO2: 98% 98% 98% 97%  Weight:    158 lb 8 oz (71.9 kg)  Height:    5\' 3"  (1.6 m)   Physical Exam  Constitutional: She is oriented to person, place, and time. She appears well-developed. She is cooperative. No distress.  HENT:  Head: Normocephalic and atraumatic.  Right Ear: Hearing normal.  Left Ear: Hearing normal.  Nose: Nose normal. Right sinus exhibits no maxillary sinus tenderness and no frontal sinus tenderness. Left sinus exhibits no maxillary sinus tenderness and no frontal sinus tenderness.  Mouth/Throat: Oropharynx is clear and moist and mucous membranes are normal.  Cardiovascular: Normal rate, regular rhythm, S1 normal, S2 normal and intact distal pulses.  Exam reveals no gallop.   No murmur heard. Pulmonary/Chest: Effort normal. No respiratory distress. She has wheezes (diffuse R lung wheeze, left CTA). She has no rhonchi. She has no rales. She exhibits no tenderness. Breasts are symmetrical.    Abdominal: Soft. Normal appearance and bowel sounds are normal. She exhibits no ascites. There is no hepatosplenomegaly. There is no tenderness. There is no CVA tenderness.  Musculoskeletal: Normal range of motion. She exhibits no edema or tenderness.  Neurological: She is alert and oriented to person, place, and time. She has normal strength.  Skin: Skin is warm, dry and intact. She is not diaphoretic.  Psychiatric: She has a normal mood and affect. Her speech is normal and behavior is normal.   Labs: CBC:  Recent Labs Lab 06/15/16 1517  WBC 9.6  NEUTROABS 8.5*  HGB 14.2  HCT 41.6  MCV 85.2  PLT 180   Basic Metabolic Panel:  Recent Labs Lab 06/15/16 1517  NA 136  K 3.4*  CL 103  CO2 21*  GLUCOSE 146*  BUN 5*  CREATININE 0.71  CALCIUM 9.4   LFT:  Recent Labs Lab 06/15/16 1517  AST 27  ALT 17  ALKPHOS 63  BILITOT 0.7  PROT 7.6  ALBUMIN 4.1   Dg Chest 2 View  Result Date: 06/15/2016 CLINICAL DATA:  Nonproductive cough for 2 days, fever, wheezing EXAM: CHEST  2 VIEW COMPARISON:  None FINDINGS: Normal heart size, mediastinal contours, and pulmonary vascularity. Central peribronchial thickening with LEFT perihilar infiltrate question pneumonia. Remaining lungs clear. No pleural effusion or pneumothorax. No acute focal osseous abnormalities. IMPRESSION: Bronchitic changes with questionable LEFT perihilar infiltrate/pneumonia. Electronically Signed   By: Ulyses Southward M.D.   On: 06/15/2016 15:55   Assessment & Plan by Problem: Active Problems:   CAP (community acquired pneumonia)  Mariah Joyce is a 36 y.o. female without PMH who presents with URI sx found to have LA and CXR finding concerning for PNA  1) CAP: CXR concerning for L PNA. Exam concerning for R lung wheezing. Started on CAP abx therapy in the ED. LA w/ concern for dehydration. Tx w/ IVF. Concern for PE low, Well's low risk, but PERC is not negative 2/2 tachycardia. Will assess with  D-dimer. - admit to Med-Surg Obs - CTX/Azitho - IVF 100cc/hr - Trend LA - Procal - strep pneumo urine Ag - D-dimer - Duonebs q4h PRN -  O2, wean as tolerated  DVT PPx - low molecular weight heparin  Code Status - Full  Dispo: Admit patient to Observation with expected length of stay less than 2 midnights.  Signed: Carolynn Comment, MD 06/15/2016, 10:38 PM  Pager: [phone]

## 2016-06-15 NOTE — ED Triage Notes (Signed)
Patient complains of cough x 2 days. Today developed fever with wheezing. Speaking complete sentences on arrival but wheezing noted, skin warm to touch, alert and oriented

## 2016-06-15 NOTE — ED Provider Notes (Signed)
[facility]-EMERGENCY DEPT Provider Note   CSN: [id number] Arrival date & time: 06/15/16  1457     History   Chief Complaint Chief Complaint  Patient presents with  . Cough  . Fever    HPI Mariah Joyce is a 36 y.o. female presenting with 2 days of cough and runny nose with subjective fever. She had a sudden onset of shortness of breath at 11 AM this morning but currently has no complaints. She has not tried anything for the symptoms. She had a low-grade fever upon arrival and was given Tylenol. She denies chest pain, nausea, vomiting, diarrhea, dysuria, or any other symptoms. She does have a history of ill contacts with her husband and son with flu. No calf swelling, calf pain, history of DVT/PE, no hemoptysis no history to use, recent immobilization or surgeries.  HPI  Past Medical History:  Diagnosis Date  . Complication of anesthesia   . GDM (gestational diabetes mellitus)   . Medical history non-contributory     Patient Active Problem List   Diagnosis Date Noted  . 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 H/O vaginal delivery prior to C/S 03/11/2014  . Language barrier 03/10/2014  . Obesity 03/10/2014  . History of gestational diabetes mellitus (GDM) 03/10/2014    Past Surgical History:  Procedure Laterality Date  . CESAREAN SECTION     for non-reassuring fhr    OB History    Gravida Para Term Preterm AB Living   4 3 3  0 1 3   SAB TAB Ectopic Multiple Live Births   1 0 0 0 3       Home Medications    Prior to Admission medications   Medication Sig Start Date End Date Taking? Authorizing Provider  etonogestrel (NEXPLANON) 68 MG IMPL implant 1 each by Subdermal route once.    Historical Provider, MD  ibuprofen (ADVIL,MOTRIN) 600 MG tablet Take 1 tablet (600 mg total) by mouth every 6 (six) hours. Patient not taking: Reported on 05/04/2016 03/14/14   Venus Standard, CNM  oxyCODONE-acetaminophen (PERCOCET/ROXICET) 5-325 MG per tablet Take 1 tablet by mouth  every 4 (four) hours as needed (for pain scale less than 7). Patient not taking: Reported on 05/04/2016 03/14/14   Venus Standard, CNM  prenatal vitamin w/FE, FA (PRENATAL 1 + 1) 27-1 MG TABS tablet Take 1 tablet by mouth daily. Patient not taking: Reported on 05/04/2016 07/18/13   Deirdre Colin Mulders, CNM    Family History No family history on file.  Social History Social History  Substance Use Topics  . Smoking status: Never Smoker  . Smokeless tobacco: Never Used  . Alcohol use No     Allergies   Patient has no known allergies.   Review of Systems Review of Systems  Constitutional: Positive for fever. Negative for chills.  HENT: Positive for rhinorrhea. Negative for congestion, ear pain and sore throat.   Eyes: Negative for visual disturbance.  Respiratory: Positive for cough and shortness of breath. Negative for chest tightness, wheezing and stridor.   Cardiovascular: Negative for chest pain and leg swelling.  Gastrointestinal: Negative for abdominal distention, abdominal pain, blood in stool, diarrhea, nausea and vomiting.  Genitourinary: Negative for difficulty urinating, dysuria, flank pain and hematuria.  Musculoskeletal: Negative for gait problem, myalgias, neck pain and neck stiffness.  Skin: Negative for color change, pallor and rash.  Neurological: Negative for dizziness, syncope, speech difficulty, weakness, light-headedness and headaches.  Psychiatric/Behavioral: Negative for behavioral problems.     Physical Exam Updated Vital  Signs BP 102/59   Pulse 108   Temp 99 F (37.2 C) (Oral)   Resp (!) 39   SpO2 98%   Physical Exam  Constitutional: She appears well-developed and well-nourished. No distress.  Patient with low-grade fever, nontoxic-appearing, sitting comfortably in bed in no acute distress. She denies any symptoms at this time.  HENT:  Head: Normocephalic and atraumatic.  Mouth/Throat: Oropharynx is clear and moist. No oropharyngeal exudate.  Eyes:  Conjunctivae and EOM are normal. Pupils are equal, round, and reactive to light. Right eye exhibits no discharge. Left eye exhibits no discharge.  Neck: Normal range of motion. Neck supple.  Cardiovascular: Normal rate, regular rhythm and normal heart sounds.   No murmur heard. Pulmonary/Chest: Effort normal and breath sounds normal. No respiratory distress. She has no wheezes. She has no rales.  Abdominal: Soft. She exhibits no distension. There is no tenderness. There is no guarding.  Musculoskeletal: She exhibits no edema.  Neurological: She is alert.  Skin: Skin is warm and dry. No rash noted. She is not diaphoretic. No pallor.  Psychiatric: She has a normal mood and affect.  Nursing note and vitals reviewed.    ED Treatments / Results  Labs (all labs ordered are listed, but only abnormal results are displayed) Labs Reviewed  COMPREHENSIVE METABOLIC PANEL - Abnormal; Notable for the following:       Result Value   Potassium 3.4 (*)    CO2 21 (*)    Glucose, Bld 146 (*)    BUN 5 (*)    All other components within normal limits  CBC WITH DIFFERENTIAL/PLATELET - Abnormal; Notable for the following:    Neutro Abs 8.5 (*)    Lymphs Abs 0.6 (*)    All other components within normal limits  I-STAT CG4 LACTIC ACID, ED - Abnormal; Notable for the following:    Lactic Acid, Venous 3.22 (*)    All other components within normal limits  I-STAT CG4 LACTIC ACID, ED - Abnormal; Notable for the following:    Lactic Acid, Venous 2.81 (*)    All other components within normal limits  INFLUENZA PANEL BY PCR (TYPE A & B)    EKG  EKG Interpretation None       Radiology Dg Chest 2 View  Result Date: 06/15/2016 CLINICAL DATA:  Nonproductive cough for 2 days, fever, wheezing EXAM: CHEST  2 VIEW COMPARISON:  None FINDINGS: Normal heart size, mediastinal contours, and pulmonary vascularity. Central peribronchial thickening with LEFT perihilar infiltrate question pneumonia. Remaining lungs  clear. No pleural effusion or pneumothorax. No acute focal osseous abnormalities. IMPRESSION: Bronchitic changes with questionable LEFT perihilar infiltrate/pneumonia. Electronically Signed   By: Ulyses SouthwardMark  Boles M.D.   On: 06/15/2016 15:55    Procedures Procedures (including critical care time)  Medications Ordered in ED Medications  albuterol (PROVENTIL) (2.5 MG/3ML) 0.083% nebulizer solution (  Not Given 06/15/16 1553)  acetaminophen (TYLENOL) 325 MG tablet (not administered)  albuterol (PROVENTIL) (2.5 MG/3ML) 0.083% nebulizer solution 5 mg (5 mg Nebulization Given 06/15/16 1505)  acetaminophen (TYLENOL) tablet 650 mg (650 mg Oral Given 06/15/16 1505)  sodium chloride 0.9 % bolus 2,000 mL (0 mLs Intravenous Stopped 06/15/16 1851)  ibuprofen (ADVIL,MOTRIN) tablet 600 mg (600 mg Oral Given 06/15/16 1639)  cefTRIAXone (ROCEPHIN) 1 g in dextrose 5 % 50 mL IVPB (0 g Intravenous Stopped 06/15/16 1709)  azithromycin (ZITHROMAX) 500 mg in dextrose 5 % 250 mL IVPB (0 mg Intravenous Stopped 06/15/16 1928)     Initial Impression /  Assessment and Plan / ED Course  I have reviewed the triage vital signs and the nursing notes.  Pertinent labs & imaging results that were available during my care of the patient were reviewed by me and considered in my medical decision making (see chart for details).     Otherwise healthy 36 year old female presenting with 2 days of cough, runny nose, fever, and shortness of breath onset today. Chest x-ray shows signs of pneumonia. She has had recent ill contacts with husband and son with flu and I ordered a influenza PCR to determine if we should treat her with Tamiflu as well as antibiotics.  Exam otherwise reassuring, she was initially asymptomatic with tachycardia. Lungs are clear and equal bilaterally. O2 sats of 96% on room air. No history of prolonged immobilization, recent surgery, leg swelling, pain, history of DVT/PE, estrogen use or hemoptysis. Cannot use PERC rule  due to tachycardia Wells criteria score of 1.5 making her low risk for PE.  CXR- left perihilar pneumonia Ordered 2 L IV bolus, Rocephin, azithromycin and will observe until influenza panel returns. Negative for flu.  19:00- nurse stated that she was doing well and felt better but when she walked her to the restroom she was tachycardic at 147 when she got back to her room very short of breath.  When I reassessed her, she continues to feel short of breath, tachypneic and tachycardic despite fluids and she was placed on 2L [place].  Patient will need to be admitted for observation overnight with IV antibiotics. Spoke to admitting physician Dr. Johnny Bridge regarding this patient.   Final Clinical Impressions(s) / ED Diagnoses   Final diagnoses:  Community acquired pneumonia of left lung, unspecified part of lung    New Prescriptions New Prescriptions   No medications on file     Gregary Cromer 06/15/16 2026    Azalia Bilis, MD 06/16/16 [phone]

## 2016-06-15 NOTE — ED Notes (Signed)
Dr. Eudelia Bunchardama made aware of patient lactic acid and room placement.

## 2016-06-15 NOTE — ED Notes (Addendum)
Placed pt on 2L [place].  SPO2 was 98%, RR is 37, and pt was c/o of SOB.  PA made aware.

## 2016-06-15 NOTE — ED Notes (Signed)
Informed first nurse and triage nurse of  lactid acid 3.2522mmol/L

## 2016-06-16 DIAGNOSIS — J129 Viral pneumonia, unspecified: Secondary | ICD-10-CM | POA: Diagnosis not present

## 2016-06-16 DIAGNOSIS — J209 Acute bronchitis, unspecified: Secondary | ICD-10-CM

## 2016-06-16 LAB — RESPIRATORY PANEL BY PCR
ADENOVIRUS-RVPPCR: NOT DETECTED
BORDETELLA PERTUSSIS-RVPCR: NOT DETECTED
CHLAMYDOPHILA PNEUMONIAE-RVPPCR: NOT DETECTED
CORONAVIRUS 229E-RVPPCR: NOT DETECTED
CORONAVIRUS HKU1-RVPPCR: NOT DETECTED
CORONAVIRUS NL63-RVPPCR: NOT DETECTED
Coronavirus OC43: NOT DETECTED
Influenza A: NOT DETECTED
Influenza B: NOT DETECTED
Metapneumovirus: NOT DETECTED
Mycoplasma pneumoniae: NOT DETECTED
Parainfluenza Virus 1: NOT DETECTED
Parainfluenza Virus 2: NOT DETECTED
Parainfluenza Virus 3: NOT DETECTED
Parainfluenza Virus 4: NOT DETECTED
Respiratory Syncytial Virus: NOT DETECTED
Rhinovirus / Enterovirus: DETECTED — AB

## 2016-06-16 LAB — COMPREHENSIVE METABOLIC PANEL
ALK PHOS: 51 U/L (ref 38–126)
ALT: 12 U/L — AB (ref 14–54)
ANION GAP: 8 (ref 5–15)
AST: 16 U/L (ref 15–41)
Albumin: 3.2 g/dL — ABNORMAL LOW (ref 3.5–5.0)
BILIRUBIN TOTAL: 0.7 mg/dL (ref 0.3–1.2)
BUN: 5 mg/dL — ABNORMAL LOW (ref 6–20)
CALCIUM: 8.4 mg/dL — AB (ref 8.9–10.3)
CO2: 22 mmol/L (ref 22–32)
CREATININE: 0.63 mg/dL (ref 0.44–1.00)
Chloride: 109 mmol/L (ref 101–111)
GFR calc non Af Amer: >60 mL/min (ref >60)
GLUCOSE: 140 mg/dL — AB (ref 65–99)
Potassium: 3.5 mmol/L (ref 3.5–5.1)
SODIUM: 139 mmol/L (ref 135–145)
TOTAL PROTEIN: 6.2 g/dL — AB (ref 6.5–8.1)

## 2016-06-16 LAB — HIV ANTIBODY (ROUTINE TESTING W REFLEX): HIV Screen 4th Generation wRfx: NONREACTIVE

## 2016-06-16 LAB — D-DIMER, QUANTITATIVE (NOT AT ARMC): D DIMER QUANT: 0.32 ug{FEU}/mL (ref 0.00–0.50)

## 2016-06-16 LAB — CBC
HCT: 36.7 % (ref 36.0–46.0)
Hemoglobin: 12.3 g/dL (ref 12.0–15.0)
MCH: 28.7 pg (ref 26.0–34.0)
MCHC: 33.5 g/dL (ref 30.0–36.0)
MCV: 85.7 fL (ref 78.0–100.0)
PLATELETS: 158 10*3/uL (ref 150–400)
RBC: 4.28 MIL/uL (ref 3.87–5.11)
RDW: 12.8 % (ref 11.5–15.5)
WBC: 8 10*3/uL (ref 4.0–10.5)

## 2016-06-16 LAB — GLUCOSE, CAPILLARY: GLUCOSE-CAPILLARY: 139 mg/dL — AB (ref 65–99)

## 2016-06-16 LAB — STREP PNEUMONIAE URINARY ANTIGEN: STREP PNEUMO URINARY ANTIGEN: NEGATIVE

## 2016-06-16 MED ORDER — ALBUTEROL SULFATE (2.5 MG/3ML) 0.083% IN NEBU: 3.0000 mL | INHALATION_SOLUTION | Freq: Four times a day (QID) | RESPIRATORY_TRACT | Status: DC

## 2016-06-16 MED ORDER — ACETAMINOPHEN 325 MG PO TABS: 325.0000 mg | ORAL_TABLET | Freq: Four times a day (QID) | ORAL | 0 refills | Status: DC | PRN

## 2016-06-16 MED ORDER — FLUTICASONE PROPIONATE 50 MCG/ACT NA SUSP: 1.0000 | Freq: Every day | NASAL | 2 refills | Status: DC

## 2016-06-16 MED ORDER — ALBUTEROL SULFATE HFA 108 (90 BASE) MCG/ACT IN AERS
1.0000 | INHALATION_SPRAY | Freq: Once | RESPIRATORY_TRACT | Status: DC
Start: 2016-06-16 — End: 2016-06-16
  Filled 2016-06-16: qty 6.7

## 2016-06-16 MED ORDER — GUAIFENESIN-DM 100-10 MG/5ML PO SYRP: 5.0000 mL | ORAL_SOLUTION | ORAL | 0 refills | Status: DC | PRN

## 2016-06-16 MED ORDER — ALBUTEROL SULFATE HFA 108 (90 BASE) MCG/ACT IN AERS: 2.0000 | INHALATION_SPRAY | Freq: Four times a day (QID) | RESPIRATORY_TRACT | 2 refills | Status: DC | PRN

## 2016-06-16 NOTE — Progress Notes (Signed)
RT did teaching on how to use albuterol inhaler.  Pt left floor via wheelchair accompanied by staff and family

## 2016-06-16 NOTE — Discharge Summary (Signed)
Name: Mariah Joyce MRN: [id number] DOB: 11/12/1980 36 y.o. PCP: No Pcp Per Patient  Date of Admission: 06/15/2016  3:21 PM Date of Discharge: 06/16/2016 Attending Physician: Cephas DarbyJames Granfortuna, MD  Discharge Diagnosis: Viral pneumonia Principal Problem:   Pneumonia, viral Active Problems:   CAP (community acquired pneumonia)   Acute bronchitis   Discharge Medications: Allergies as of 06/16/2016   No Known Allergies     Medication List    TAKE these medications   acetaminophen 325 MG tablet Commonly known as:  TYLENOL Take 1 tablet (325 mg total) by mouth every 6 (six) hours as needed for mild pain (or Fever).   albuterol 108 (90 Base) MCG/ACT inhaler Commonly known as:  PROVENTIL HFA;VENTOLIN HFA Inhale 2 puffs into the lungs every 6 (six) hours as needed for wheezing or shortness of breath.   fluticasone 50 MCG/ACT nasal spray Commonly known as:  FLONASE Place 1 spray into both nostrils daily. For nasal congestion.   guaiFENesin-dextromethorphan 100-10 MG/5ML syrup Commonly known as:  ROBITUSSIN DM Take 5 mLs by mouth every 4 (four) hours as needed for cough.       Disposition and follow-up:   Mariah Joyce was [facility] in Stable condition.  At the hospital follow up visit please address:  1.   Viral pneumonia: --has the patient experienced further shortness of breath? Does it improve with albuterol? --is she still having a productive cough? Better, worse? --any further fevers? --d/c with albuterol inhaler, flonase, mucinex, and tylenol   Patient does not have PCP, she was provided with list of local clinics who are accepting medicaid patients. She may wish [facility] after [facility]; please address.  2.  Labs / imaging needed at time of follow-up: None  3.  Pending labs/ test needing follow-up: None  Follow-up Appointments: Follow-up Information    [facility]. Call.   Why:  If you are not contacted by our clinic by Monday, please call the above number to set up hospital follow up appointment. Contact information: [address] [place] [phone]          Hospital Course by problem list: Principal Problem:   Pneumonia, viral Active Problems:   CAP (community acquired pneumonia)   Acute bronchitis   Viral pneumonia: Patient admitted with 2 day history of subjective fevers, chills, mildly productive cough, URI symptoms and shortness of breath; in the ED she was found to have low-grade fever, lactic acidosis, and CXR with possible patchy infiltrate. Due to shortness of breath and mild tachycardia, D dimer was checked and was negative. She was empirically started on azithromycin and ceftriaxone for community acquired pneumonia. Pro-calcitonin was not consistent with bacterial infection; flu panel was negative; strep pneumo urinary antigen was negative; respiratory viral panel was positive only for rhinovirus/enterovirus. Exam was positive for mild expiratory wheezing. Lactic acidosis improved with IVF. Day after admission, patient endorsed feeling greatly improved and wished to go home. She was ambulated with pulse oxymetry and was able to maintain her saturations in high 90's. She was not continued on antibiotics in setting of likely viral source of her infection. She was discharged with symptomatic treatment with tylenol, mucinex, flonase, and was instructed in the use of albuterol inhaler for wheezing/shortness of breath. Strict return precautions were given.  Discharge Vitals:   BP 111/72 (BP Location: Right Arm)   Pulse 97   Temp 99.3 F (  37.4 C) (Oral)   Resp 18   Ht 5\' 3"  (1.6 m)   Wt 158 lb 8 oz (71.9 kg)   SpO2 98%   BMI 28.08 kg/m   Pertinent Labs, Studies, and Procedures:  CBC    Component Value Date/Time   WBC 8.0 06/16/2016 0021   RBC 4.28 06/16/2016 0021   HGB 12.3  06/16/2016 0021   HCT 36.7 06/16/2016 0021   PLT 158 06/16/2016 0021   MCV 85.7 06/16/2016 0021   MCH 28.7 06/16/2016 0021   MCHC 33.5 06/16/2016 0021   RDW 12.8 06/16/2016 0021   LYMPHSABS 0.6 (L) 06/15/2016 1517   MONOABS 0.4 06/15/2016 1517   EOSABS 0.1 06/15/2016 1517   BASOSABS 0.0 06/15/2016 1517   BMET    Component Value Date/Time   NA 139 06/16/2016 0021   K 3.5 06/16/2016 0021   CL 109 06/16/2016 0021   CO2 22 06/16/2016 0021   GLUCOSE 140 (H) 06/16/2016 0021   BUN 5 (L) 06/16/2016 0021   CREATININE 0.63 06/16/2016 0021   CALCIUM 8.4 (L) 06/16/2016 0021   GFRNONAA >60 06/16/2016 0021   GFRAA >60 06/16/2016 0021   Adenovirus NOT DETECTED NOT DETECTED   Coronavirus 229E NOT DETECTED NOT DETECTED   Coronavirus HKU1 NOT DETECTED NOT DETECTED   Coronavirus NL63 NOT DETECTED NOT DETECTED   Coronavirus OC43 NOT DETECTED NOT DETECTED   Metapneumovirus NOT DETECTED NOT DETECTED   Rhinovirus / Enterovirus NOT DETECTED DETECTED    Influenza A NOT DETECTED NOT DETECTED   Influenza B NOT DETECTED NOT DETECTED   Parainfluenza Virus 1 NOT DETECTED NOT DETECTED   Parainfluenza Virus 2 NOT DETECTED NOT DETECTED   Parainfluenza Virus 3 NOT DETECTED NOT DETECTED   Parainfluenza Virus 4 NOT DETECTED NOT DETECTED   Respiratory Syncytial Virus NOT DETECTED NOT DETECTED   Bordetella pertussis NOT DETECTED NOT DETECTED   Chlamydophila pneumoniae NOT DETECTED NOT DETECTED   Mycoplasma pneumoniae NOT DETECTED NOT DETECTED    HIV ab screen: nonreactive  Discharge Instructions: Discharge Instructions    Call MD for:  difficulty breathing, headache or visual disturbances    Complete by:  As directed    Call MD for:  extreme fatigue    Complete by:  As directed    Call MD for:  persistant dizziness or light-headedness    Complete by:  As directed    Call MD for:  persistant nausea and vomiting    Complete by:  As directed    Call MD for:  severe uncontrolled pain    Complete by:   As directed    Call MD for:  temperature >100.4    Complete by:  As directed    Diet - low sodium heart healthy    Complete by:  As directed    Discharge instructions    Complete by:  As directed    I have provided a document which lists local physician offices who take medicaid patients; please contact one to get established. You will be contacted by our clinic to set up a hospital follow up appointment. You are welcome to establish with [place] if you like.  If you have shortness of breath or wheezing, you can use your albuterol inhaler as instructed today, every 6 hours.  For mild fevers, you can take over the counter Tylenol (acetaminophen) 325mg  every 4 hours if you need it.   For nasal congestion, use flonase nasal spray twice a day.  If you have worsening fevers,  cough that is more productive, shortness of breath not relieved by albuterol, nausea, vomiting; please be evaluated by a physician either in the emergency department or outpatient office.   Increase activity slowly    Complete by:  As directed       Signed: Nyra Market, MD 06/16/2016, 3:41 PM   Pager [phone]

## 2016-06-16 NOTE — Progress Notes (Addendum)
   Subjective:  Patient interviewed and evaluated with help of [employer]. She states she feels significantly better today. She denies shortness of breath or chest pain. She still has some wheezing and cough intermittently productive of white sputum. She reports she has had symptomatic relief with the breathing treatments.   We discussed the results of her lab work and imaging. She is in agreement with discharge today and is looking forward to going home.   Objective:  Vital signs in last 24 hours: Vitals:   06/15/16 2146 06/16/16 0001 06/16/16 0442 06/16/16 0541  BP: 122/79   111/72  Pulse: (!) 115   97  Resp: 18   18  Temp: 99.5 F (37.5 C)   99.3 F (37.4 C)  TempSrc: Oral   Oral  SpO2: 97% 98% 97% 100%  Weight: 158 lb 8 oz (71.9 kg)     Height: 5\' 3"  (1.6 m)      Constitutional: NAD, pleasant, sitting up in bed CV: RRR, no murmurs, rubs or gallops appreciated, pulses intact Resp: No increased work of breathing, no rales or rhonchi. Mild wheezing in RLL  Assessment/Plan:  Active Problems:   CAP (community acquired pneumonia)  Viral pneumonia:  Patient is clinically improved today. She was flu negative, procalcitonin was undetectable, Strep pneumo antigen was negative, and CXR does not show classic lobar pneumonia. D dimer was negative. Respiratory panel was positive for rhinovirus/enterovirus. Her lactic acidosis which is what drove her admission resolved quickly with IVF.  --discharge today; no need for continued antibiotics --albuterol PRN for wheezing/shortness of breath; tylenol for fevers; flonase, guaifenesin  --uncertain as to why patient still on oxygen as she was never hypoxic; will wean and ambulate before discharge  Dispo: Anticipated discharge today.   Nyra MarketGorica Jabaree Mercado, MD 06/16/2016, 11:32 AM Pager [phone]

## 2016-06-16 NOTE — Progress Notes (Signed)
Ambulated pt in hall on RA with pulse ox.  Pt O2 sat 98% on RA while ambulating.  Pt tolerated well.

## 2016-06-16 NOTE — Progress Notes (Signed)
Admitted pt.from ED AAO X4 speaks little English accomp.by husband who speaks [place].She is SOB on exertion with 02 at 2 lnc in use.V/S taken & recorded.IV in place on LT.hand w/ occlussive dsd.intact.no redness noted.Orientation to room & ,call bell.Skin assessment done with Kristen RN. & skin is intact.Fall assessment also completed w/ pt.& able to verbalized understanding to call the nurse before getting OOB.Call bell w/ in reach .Placed pt.on heart monitor.Will continue to monitor pt.

## 2016-06-16 NOTE — Progress Notes (Signed)
Pt discharge instructions given, pt and family verbalized understanding.  IV removed.

## 2016-06-16 NOTE — Progress Notes (Signed)
RT went over with patient how to properly use inhaler. Patients husband was also present and seemed to understand a little more than patient.

## 2016-06-29 ENCOUNTER — Ambulatory Visit (INDEPENDENT_AMBULATORY_CARE_PROVIDER_SITE_OTHER): Payer: Medicaid Other | Admitting: Internal Medicine

## 2016-06-29 VITALS — BP 117/69 | HR 93 | Temp 99.0°F | Ht 62.0 in | Wt 159.9 lb

## 2016-06-29 DIAGNOSIS — N898 Other specified noninflammatory disorders of vagina: Secondary | ICD-10-CM | POA: Diagnosis not present

## 2016-06-29 DIAGNOSIS — R300 Dysuria: Secondary | ICD-10-CM | POA: Diagnosis not present

## 2016-06-29 DIAGNOSIS — J129 Viral pneumonia, unspecified: Secondary | ICD-10-CM

## 2016-06-29 DIAGNOSIS — Z09 Encounter for follow-up examination after completed treatment for conditions other than malignant neoplasm: Secondary | ICD-10-CM | POA: Diagnosis not present

## 2016-06-29 DIAGNOSIS — Z8701 Personal history of pneumonia (recurrent): Secondary | ICD-10-CM | POA: Diagnosis not present

## 2016-06-29 HISTORY — DX: Other specified noninflammatory disorders of vagina: N89.8

## 2016-06-29 MED ORDER — FLUCONAZOLE 150 MG PO TABS: 150.0000 mg | ORAL_TABLET | Freq: Once | ORAL | 0 refills | Status: DC

## 2016-06-29 MED ORDER — FLUCONAZOLE 150 MG PO TABS: 150.0000 mg | ORAL_TABLET | Freq: Once | ORAL | 0 refills | Status: AC

## 2016-06-29 NOTE — Progress Notes (Signed)
   CC: hospital follow up, vaginal itching  HPI:  Mariah Joyce is a 36 y.o. with a PMH of gestational DM, presenting to clinic for hospital follow up for viral pneumonitis and vaginal itching.   Interview was facilitated by [employer].   Patient states that she has completely recovered from her viral pneumonitis; she denies further wheezing, shortness of breath, cough. She denies fevers, chills, nausea, vomiting, diarrhea, constipation.  Patient endorses several day history of vaginal itching. She endorses some dysuria along with it, but denies hematuria, vaginal discharge, suprabupic pain, flank pain, fevers, skin lesions. She has tried Monistat at home starting day prior to presentation; this relieves symptoms but only temporarily.  Please see problem based Assessment and Plan for status of patients chronic conditions.  Past Medical History:  Diagnosis Date  . Complication of anesthesia   . GDM (gestational diabetes mellitus)   . Medical history non-contributory     Review of Systems:   Review of Systems  Constitutional: Negative for chills and fever.  HENT: Negative for congestion and sore throat.   Respiratory: Negative for cough, shortness of breath and wheezing.   Gastrointestinal: Negative for abdominal pain, constipation, diarrhea, nausea and vomiting.  Genitourinary: Positive for dysuria. Negative for flank pain, frequency, hematuria and urgency.  Skin: Positive for itching (vaginal).    Physical Exam:  Vitals:   06/29/16 1129  BP: 117/69  Pulse: 93  Temp: 99 F (37.2 C)  TempSrc: Oral  SpO2: 100%  Weight: 159 lb 14.4 oz (72.5 kg)  Height: 5\' 2"  (1.575 m)   Physical Exam  Constitutional: She is oriented to person, place, and time. She appears well-developed and well-nourished. No distress.  HENT:  Head: Normocephalic and atraumatic.  Eyes: Conjunctivae and EOM are normal.  Neck: Normal range of motion. Neck supple.    Cardiovascular: Normal rate, regular rhythm, normal heart sounds and intact distal pulses.  Exam reveals no gallop and no friction rub.   No murmur heard. Pulmonary/Chest: Effort normal and breath sounds normal. She has no wheezes. She has no rales.  Abdominal: Soft. Bowel sounds are normal. She exhibits no distension. There is no tenderness. There is no guarding.  Genitourinary:  Genitourinary Comments: Patient declined pelvic exam  Musculoskeletal: Normal range of motion. She exhibits no edema.  Neurological: She is alert and oriented to person, place, and time. No cranial nerve deficit.  Skin: Skin is warm and dry. Capillary refill takes less than 2 seconds. She is not diaphoretic.  Psychiatric: She has a normal mood and affect. Her behavior is normal. Judgment and thought content normal.    Assessment & Plan:   See Encounters Tab for problem based charting.   Patient discussed with Dr. Argentina PonderGranfortuna   Gaye Scorza, MD Internal Medicine PGY1

## 2016-06-29 NOTE — Patient Instructions (Signed)
It was nice seeing you today!  I sent a tablet (fluconazole) to take for your itching. Just one pill once. I will test your urine and see if there is another infection we may need to treat.

## 2016-06-30 ENCOUNTER — Telehealth: Payer: Self-pay | Admitting: Internal Medicine

## 2016-06-30 LAB — URINALYSIS, COMPLETE
BILIRUBIN UA: NEGATIVE
Glucose, UA: NEGATIVE
Ketones, UA: NEGATIVE
NITRITE UA: NEGATIVE
PROTEIN UA: NEGATIVE
Specific Gravity, UA: 1.005 (ref 1.005–1.030)
UUROB: 0.2 mg/dL (ref 0.2–1.0)
pH, UA: 5 (ref 5.0–7.5)

## 2016-06-30 LAB — MICROSCOPIC EXAMINATION: Casts: NONE SEEN /lpf

## 2016-06-30 MED ORDER — NITROFURANTOIN MONOHYD MACRO 100 MG PO CAPS: 100.0000 mg | ORAL_CAPSULE | Freq: Two times a day (BID) | ORAL | 0 refills | Status: AC

## 2016-06-30 NOTE — Telephone Encounter (Signed)
Attempted contacting patient about her urinalysis showing UTI rather than yeast infection as we had thought. [employer] was used to help facilitate conversation. Patient was unable to be reached and voice mail was left that new script was at the pharmacy as we had discussed this possibility at her visit.   Nyra MarketGorica Remigio Mcmillon, MD IMTS - PGY1 Pager [phone]

## 2016-07-01 ENCOUNTER — Encounter: Payer: Self-pay | Admitting: Internal Medicine

## 2016-07-01 NOTE — Assessment & Plan Note (Signed)
Patient endorses several day history of vaginal itching. She endorses some dysuria along with it, but denies hematuria, vaginal discharge, suprabupic pain, flank pain, fevers, skin lesions. She has tried Monistat at home starting day prior to presentation; this relieves symptoms but only temporarily.  Exam does not reveal suprapubic tenderness or CVA tenderness. Patient declines pelvic exam today. Her last pap smear was 12/17 - no malignancy, HPV negative; candida, trich, BV negative.  As main symptom endorsed by patient is vaginal itching, it's likely she may have a yeast infection. I will obtain a UA in the meantime. Patient in agreeable to empiric treatment of yeast infection with fluconazole 150mg  once. She is aware I will contact her about need to treat UTI if UA is positive.  Plan: --fluconazole 150mg  once --UA w/ microscopy -mod Leukocytes, bacteria - macrobid 100mg  BID x 5 d --called patient and left voicemail via [employer]

## 2016-07-01 NOTE — Assessment & Plan Note (Signed)
Patient states that she has completely recovered from her viral pneumonitis; she denies further wheezing, shortness of breath, cough.

## 2016-07-05 NOTE — Progress Notes (Signed)
Medicine attending: Medical history, presenting problems, physical findings, and medications, reviewed with resident physician Dr Gorica Svalina on the day of the patient visit and I concur with her evaluation and management plan. 

## 2017-01-09 DIAGNOSIS — L03032 Cellulitis of left toe: Secondary | ICD-10-CM | POA: Diagnosis not present

## 2017-01-27 DIAGNOSIS — B373 Candidiasis of vulva and vagina: Secondary | ICD-10-CM | POA: Diagnosis not present

## 2017-02-09 DIAGNOSIS — L308 Other specified dermatitis: Secondary | ICD-10-CM | POA: Diagnosis not present

## 2017-05-08 ENCOUNTER — Ambulatory Visit (HOSPITAL_COMMUNITY)
Admission: EM | Admit: 2017-05-08 | Discharge: 2017-05-08 | Disposition: A | Discharge status: Home / Self Care | Payer: BLUE CROSS/BLUE SHIELD | Attending: Family Medicine | Admitting: Family Medicine

## 2017-05-08 ENCOUNTER — Encounter (HOSPITAL_COMMUNITY): Payer: Self-pay | Admitting: Emergency Medicine

## 2017-05-08 DIAGNOSIS — J00 Acute nasopharyngitis [common cold]: Secondary | ICD-10-CM | POA: Diagnosis not present

## 2017-05-08 DIAGNOSIS — J029 Acute pharyngitis, unspecified: Secondary | ICD-10-CM | POA: Diagnosis present

## 2017-05-08 DIAGNOSIS — Z79899 Other long term (current) drug therapy: Secondary | ICD-10-CM | POA: Diagnosis not present

## 2017-05-08 DIAGNOSIS — N898 Other specified noninflammatory disorders of vagina: Secondary | ICD-10-CM | POA: Diagnosis not present

## 2017-05-08 LAB — POCT RAPID STREP A: Streptococcus, Group A Screen (Direct): NEGATIVE

## 2017-05-08 MED ORDER — CETIRIZINE HCL 10 MG PO TABS: 10.0000 mg | ORAL_TABLET | Freq: Every day | ORAL | 0 refills | Status: DC

## 2017-05-08 MED ORDER — FLUTICASONE PROPIONATE 50 MCG/ACT NA SUSP: 2.0000 | Freq: Every day | NASAL | 0 refills | Status: DC

## 2017-05-08 MED ORDER — ALBUTEROL SULFATE HFA 108 (90 BASE) MCG/ACT IN AERS: 2.0000 | INHALATION_SPRAY | Freq: Four times a day (QID) | RESPIRATORY_TRACT | 0 refills | Status: DC | PRN

## 2017-05-08 MED ORDER — FLUCONAZOLE 150 MG PO TABS: 150.0000 mg | ORAL_TABLET | Freq: Every day | ORAL | 0 refills | Status: DC

## 2017-05-08 NOTE — Discharge Instructions (Signed)
Start flonase, zyrtec for nasal congestion. You can use over the counter nasal saline rinse such as neti pot for nasal congestion. Keep hydrated, your urine should be clear to pale yellow in color. Tylenol/motrin for fever and pain. Monitor for any worsening of symptoms, chest pain, shortness of breath, wheezing, swelling of the throat, follow up for reevaluation.   For sore throat try using a honey-based tea. Use 3 teaspoons of honey with juice squeezed from half lemon. Place shaved pieces of ginger into 1/2-1 cup of water and warm over stove top. Then mix the ingredients and repeat every 4 hours as needed.  You were treated empirically for yeast. Start diflucan as directed. Cytology sent, you will be contacted with any positive results that requires further treatment. Refrain from sexual activity for the next 7 days. Monitor for any worsening of symptoms, fever, abdominal pain, nausea, vomiting, to follow up for reevaluation.

## 2017-05-08 NOTE — ED Provider Notes (Signed)
[facility]    CSN: [id number] Arrival date & time: 05/08/17  1916     History   Chief Complaint Chief Complaint  Patient presents with  . Sore Throat  . Fever    HPI Mariah Joyce is a 37 y.o. female.   37 year old female comes in for 1 day history of sore throat. She has also had subjective fever. Mild cough. Denies rhinorrhea, nasal congestion, ear pain. Took some advil with some relief. No sick contact. Never smoker.  She has also had burning sensation to the vaginal area. States that she gets recurrent yeast infections and is usually treated with diflucan. Denies urinary symptoms such as frequency, dysuria, hematuria. Mild vaginal discharge without spotting. Denies abdominal pain, nausea, vomiting. LMP 05/04/2017      Past Medical History:  Diagnosis Date  . Complication of anesthesia   . GDM (gestational diabetes mellitus)   . Medical history non-contributory     Patient Active Problem List   Diagnosis Date Noted  . Vaginal itching 06/29/2016  . Pneumonia, viral   . 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 H/O vaginal delivery prior to C/S 03/11/2014  . Language barrier 03/10/2014  . Obesity 03/10/2014  . History of gestational diabetes mellitus (GDM) 03/10/2014    Past Surgical History:  Procedure Laterality Date  . CESAREAN SECTION     for non-reassuring fhr    OB History    Gravida Para Term Preterm AB Living   4 3 3  0 1 3   SAB TAB Ectopic Multiple Live Births   1 0 0 0 3       Home Medications    Prior to Admission medications   Medication Sig Start Date End Date Taking? Authorizing Provider  albuterol (PROVENTIL HFA;VENTOLIN HFA) 108 (90 Base) MCG/ACT inhaler Inhale 2 puffs into the lungs every 6 (six) hours as needed for wheezing or shortness of breath. 05/08/17   Cathie Hoops, Everett Ehrler V, PA-C  cetirizine (ZYRTEC) 10 MG tablet Take 1 tablet (10 mg total) by mouth daily. 05/08/17   Cathie Hoops, Lissy Deuser V, PA-C  fluconazole (DIFLUCAN) 150 MG tablet  Take 1 tablet (150 mg total) by mouth daily. Take second dose 72 hours later if symptoms still persists. 05/08/17   Cathie Hoops, Avyonna Wagoner V, PA-C  fluticasone (FLONASE) 50 MCG/ACT nasal spray Place 2 sprays into both nostrils daily. 05/08/17   Belinda Fisher, PA-C    Family History No family history on file.  Social History Social History   Tobacco Use  . Smoking status: Never Smoker  . Smokeless tobacco: Never Used  Substance Use Topics  . Alcohol use: No  . Drug use: No     Allergies   Patient has no known allergies.   Review of Systems Review of Systems  Reason unable to perform ROS: See HPI as above.     Physical Exam Triage Vital Signs ED Triage Vitals  Enc Vitals Group     BP 05/08/17 1943 135/83     Pulse Rate 05/08/17 1943 92     Resp 05/08/17 1943 16     Temp 05/08/17 1943 99.1 F (37.3 C)     Temp Source 05/08/17 1943 Oral     SpO2 05/08/17 1943 99 %     Weight 05/08/17 1942 159 lb (72.1 kg)     Height --      Head Circumference --      Peak Flow --      Pain Score 05/08/17 1942 8  Pain Loc --      Pain Edu? --      Excl. in GC? --    No data found.  Updated Vital Signs BP 135/83   Pulse 92   Temp 99.1 F (37.3 C) (Oral)   Resp 16   Wt 159 lb (72.1 kg)   LMP 05/04/2017   SpO2 99%   BMI 29.08 kg/m   Physical Exam  Constitutional: She is oriented to person, place, and time. She appears well-developed and well-nourished. No distress.  HENT:  Head: Normocephalic and atraumatic.  Right Ear: External ear and ear canal normal. Tympanic membrane is erythematous. Tympanic membrane is not bulging.  Left Ear: External ear and ear canal normal. Tympanic membrane is erythematous. Tympanic membrane is not bulging.  Nose: Rhinorrhea present. Right sinus exhibits no maxillary sinus tenderness and no frontal sinus tenderness. Left sinus exhibits no maxillary sinus tenderness and no frontal sinus tenderness.  Mouth/Throat: Uvula is midline and mucous membranes are normal.  Posterior oropharyngeal erythema present.  Eyes: Conjunctivae are normal. Pupils are equal, round, and reactive to light.  Neck: Normal range of motion. Neck supple.  Cardiovascular: Normal rate, regular rhythm and normal heart sounds. Exam reveals no gallop and no friction rub.  No murmur heard. Pulmonary/Chest: Effort normal and breath sounds normal. She has no decreased breath sounds. She has no wheezes. She has no rhonchi. She has no rales.  Abdominal: Soft. Bowel sounds are normal. She exhibits no mass. There is no tenderness. There is no rebound, no guarding and no CVA tenderness.  Lymphadenopathy:    She has no cervical adenopathy.  Neurological: She is alert and oriented to person, place, and time.  Skin: Skin is warm and dry.  Psychiatric: She has a normal mood and affect. Her behavior is normal. Judgment normal.     UC Treatments / Results  Labs (all labs ordered are listed, but only abnormal results are displayed) Labs Reviewed  CULTURE, GROUP A STREP [facility] [place]([facility])  POCT RAPID STREP A  CERVICOVAGINAL ANCILLARY ONLY    EKG  EKG Interpretation None       Radiology No results found.  Procedures Procedures (including critical care time)  Medications Ordered in UC Medications - No data to display   Initial Impression / Assessment and Plan / UC Course  I have reviewed the triage vital signs and the nursing notes.  Pertinent labs & imaging results that were available during my care of the patient were reviewed by me and considered in my medical decision making (see chart for details).    Rapid strep negative. Symptomatic treatment as needed. Return precautions given.   Patient was treated empirically for yeast. Diflucan as directed. Cytology sent, patient will be contacted with any positive results that require additional treatment. Patient to refrain from sexual activity for the next 7 days. Return precautions given.    Final Clinical Impressions(s) / UC Diagnoses    Final diagnoses:  Acute nasopharyngitis  Vaginal irritation    ED Discharge Orders        Ordered    albuterol (PROVENTIL HFA;VENTOLIN HFA) 108 (90 Base) MCG/ACT inhaler  Every 6 hours PRN     05/08/17 2006    fluconazole (DIFLUCAN) 150 MG tablet  Daily     05/08/17 2006    fluticasone (FLONASE) 50 MCG/ACT nasal spray  Daily     05/08/17 2006    cetirizine (ZYRTEC) 10 MG tablet  Daily     05/08/17 2006  Belinda Fisher, PA-C 05/08/17 2029

## 2017-05-08 NOTE — ED Triage Notes (Signed)
PT reports fever for a few days and sore throat that started today.   PT also reports a  Burning sensation in genital area.

## 2017-05-09 LAB — CERVICOVAGINAL ANCILLARY ONLY
Bacterial vaginitis: NEGATIVE
CANDIDA VAGINITIS: POSITIVE — AB
CHLAMYDIA, DNA PROBE: NEGATIVE
Neisseria Gonorrhea: NEGATIVE
TRICH (WINDOWPATH): NEGATIVE

## 2017-05-11 LAB — CULTURE, GROUP A STREP (THRC)

## 2017-09-26 ENCOUNTER — Encounter: Payer: Self-pay | Admitting: Obstetrics and Gynecology

## 2017-09-26 ENCOUNTER — Other Ambulatory Visit (HOSPITAL_COMMUNITY)
Admission: RE | Admit: 2017-09-26 | Discharge: 2017-09-26 | Disposition: A | Discharge status: Home / Self Care | Payer: BLUE CROSS/BLUE SHIELD | Source: Ambulatory Visit | Attending: Obstetrics and Gynecology | Admitting: Obstetrics and Gynecology

## 2017-09-26 ENCOUNTER — Ambulatory Visit (INDEPENDENT_AMBULATORY_CARE_PROVIDER_SITE_OTHER): Payer: BLUE CROSS/BLUE SHIELD | Admitting: Obstetrics and Gynecology

## 2017-09-26 DIAGNOSIS — Z01419 Encounter for gynecological examination (general) (routine) without abnormal findings: Secondary | ICD-10-CM | POA: Diagnosis not present

## 2017-09-26 DIAGNOSIS — Z319 Encounter for procreative management, unspecified: Secondary | ICD-10-CM | POA: Diagnosis not present

## 2017-09-26 DIAGNOSIS — Z202 Contact with and (suspected) exposure to infections with a predominantly sexual mode of transmission: Secondary | ICD-10-CM | POA: Diagnosis not present

## 2017-09-26 HISTORY — DX: Encounter for procreative management, unspecified: Z31.9

## 2017-09-26 HISTORY — DX: Contact with and (suspected) exposure to infections with a predominantly sexual mode of transmission: Z20.2

## 2017-09-26 NOTE — Progress Notes (Signed)
Pt presents for annual, pap, and STD testing.  

## 2017-09-26 NOTE — Progress Notes (Signed)
Patient ID: Mariah Joyce, female   DOB: 07-31-1980, 37 y.o.   MRN: [id number]  Mariah Joyce is a 37 y.o. [phone] female here for a routine annual gynecologic exam. She desires STD testing. Desires pregnancy. No contraception x 18 months. Cycles regular. Husband, healthily and father of other children. H/O GDM and PCOS.     Gynecologic History Patient's last menstrual period was 08/31/2017. Contraception: none Last Pap: 12/17. Results were: normal Last mammogram: NA. Results were: NA  Obstetric History OB History  Gravida Para Term Preterm AB Living  4 3 3  0 1 3  SAB TAB Ectopic Multiple Live Births  1 0 0 0 3    # Outcome Date GA Lbr Len/2nd Weight Sex Delivery Anes PTL Lv  4 Term 03/12/14 [redacted]w[redacted]d / 00:52 6 lb 14.8 oz (3.14 kg) M VBAC EPI  LIV  3 SAB 2014          2 Term 2011 [redacted]w[redacted]d  6 lb 8 oz (2.948 kg) M CS-LTranv Spinal  LIV  1 Term 2008 [redacted]w[redacted]d  7 lb (3.175 kg) F Vag-Spont   LIV    Past Medical History:  Diagnosis Date  . Complication of anesthesia   . GDM (gestational diabetes mellitus)   . Medical history non-contributory     Past Surgical History:  Procedure Laterality Date  . CESAREAN SECTION     for non-reassuring fhr    Current Outpatient Medications on File Prior to Visit  Medication Sig Dispense Refill  . Prenatal Vit-Fe Fum-FA-Omega (ONE-A-DAY WOMENS PRENATAL PO) Take by mouth.    Marland Kitchen albuterol (PROVENTIL HFA;VENTOLIN HFA) 108 (90 Base) MCG/ACT inhaler Inhale 2 puffs into the lungs every 6 (six) hours as needed for wheezing or shortness of breath. (Patient not taking: Reported on 09/26/2017) 1 Inhaler 0  . cetirizine (ZYRTEC) 10 MG tablet Take 1 tablet (10 mg total) by mouth daily. (Patient not taking: Reported on 09/26/2017) 15 tablet 0  . fluconazole (DIFLUCAN) 150 MG tablet Take 1 tablet (150 mg total) by mouth daily. Take second dose 72 hours later if symptoms still persists. (Patient not taking: Reported on 09/26/2017) 2 tablet 0  . fluticasone  (FLONASE) 50 MCG/ACT nasal spray Place 2 sprays into both nostrils daily. (Patient not taking: Reported on 09/26/2017) 1 g 0   No current facility-administered medications on file prior to visit.     No Known Allergies  Social History   Socioeconomic History  . Marital status: Married    Spouse name: Not on file  . Number of children: Not on file  . Years of education: Not on file  . Highest education level: Not on file  Occupational History  . Not on file  Social Needs  . Financial resource strain: Not on file  . Food insecurity:    Worry: Not on file    Inability: Not on file  . Transportation needs:    Medical: Not on file    Non-medical: Not on file  Tobacco Use  . Smoking status: Never Smoker  . Smokeless tobacco: Never Used  Substance and Sexual Activity  . Alcohol use: No  . Drug use: No  . Sexual activity: Yes    Birth control/protection: Implant  Lifestyle  . Physical activity:    Days per week: Not on file    Minutes per session: Not on file  . Stress: Not on file  Relationships  . Social connections:    Talks on phone: Not on file  Gets together: Not on file    Attends religious service: Not on file    Active member of club or organization: Not on file    Attends meetings of clubs or organizations: Not on file    Relationship status: Not on file  . Intimate partner violence:    Fear of current or ex partner: Not on file    Emotionally abused: Not on file    Physically abused: Not on file    Forced sexual activity: Not on file  Other Topics Concern  . Not on file  Social History Narrative  . Not on file    History reviewed. No pertinent family history.  The following portions of the patient's history were reviewed and updated as appropriate: allergies, current medications, past family history, past medical history, past social history, past surgical history and problem list.  Review of Systems A comprehensive review of systems was negative.    Objective:  BP 130/82   Pulse (!) 101   Wt 161 lb 14.4 oz (73.4 kg)   LMP 08/31/2017   BMI 29.61 kg/m  CONSTITUTIONAL: Well-developed, well-nourished female in no acute distress.  HENT:  Normocephalic, atraumatic, External right and left ear normal. Oropharynx is clear and moist EYES: Conjunctivae and EOM are normal. Pupils are equal, round, and reactive to light. No scleral icterus.  NECK: Normal range of motion, supple, no masses.  Normal thyroid.  SKIN: Skin is warm and dry. No rash noted. Not diaphoretic. No erythema. No pallor. NEUROLGIC: Alert and oriented to person, place, and time. Normal reflexes, muscle tone coordination. No cranial nerve deficit noted. PSYCHIATRIC: Normal mood and affect. Normal behavior. Normal judgment and thought content. CARDIOVASCULAR: Normal heart rate noted, regular rhythm RESPIRATORY: Clear to auscultation bilaterally. Effort and breath sounds normal, no problems with respiration noted. BREASTS: Symmetric in size. No masses, skin changes, nipple drainage, or lymphadenopathy. ABDOMEN: Soft, normal bowel sounds, no distention noted.  No tenderness, rebound or guarding.  PELVIC: Normal appearing external genitalia; normal appearing vaginal mucosa and cervix.  No abnormal discharge noted.  Pap smear obtained.  Normal uterine size, no other palpable masses, no uterine or adnexal tenderness. MUSCULOSKELETAL: Normal range of motion. No tenderness.  No cyanosis, clubbing, or edema.  2+ distal pulses.   Assessment:  Annual gynecologic examination with pap smear STD exposure Desire for pregnancy Plan:  Will follow up results of pap smear and manage accordingly. Routine preventative health maintenance measures emphasized. Will check additional labs d/t to desire for pregnancy, today. PG level in CD 21-23. Additional testing and follow up according to lab results.  Please refer to After Visit Summary for other counseling recommendations.    Mariah StaggersMichael L Mariah Cuffie,  MD, FACOG Attending Obstetrician & Gynecologist [facility], [facility]

## 2017-09-26 NOTE — Patient Instructions (Signed)

## 2017-09-27 LAB — CYTOLOGY - PAP
Chlamydia: NEGATIVE
Diagnosis: NEGATIVE
HPV: NOT DETECTED
Neisseria Gonorrhea: NEGATIVE
TRICH (WINDOWPATH): NEGATIVE

## 2017-09-30 LAB — HIV ANTIBODY (ROUTINE TESTING W REFLEX): HIV SCREEN 4TH GENERATION: NONREACTIVE

## 2017-09-30 LAB — HEMOGLOBIN A1C
Est. average glucose Bld gHb Est-mCnc: 134 mg/dL
Hgb A1c MFr Bld: 6.3 % — ABNORMAL HIGH (ref 4.8–5.6)

## 2017-09-30 LAB — ANTI MULLERIAN HORMONE: ANTI-MULLERIAN HORMONE (AMH): 2.67 ng/mL

## 2017-09-30 LAB — TSH: TSH: 0.988 u[IU]/mL (ref 0.450–4.500)

## 2017-09-30 LAB — RPR: RPR: NONREACTIVE

## 2017-09-30 LAB — HEPATITIS C ANTIBODY

## 2017-09-30 LAB — HEPATITIS B SURFACE ANTIGEN: Hepatitis B Surface Ag: NEGATIVE

## 2017-10-12 ENCOUNTER — Other Ambulatory Visit: Payer: BLUE CROSS/BLUE SHIELD

## 2017-10-12 DIAGNOSIS — Z01419 Encounter for gynecological examination (general) (routine) without abnormal findings: Secondary | ICD-10-CM

## 2017-10-13 LAB — PROGESTERONE: Progesterone: 0.6 ng/mL

## 2017-10-19 ENCOUNTER — Other Ambulatory Visit: Payer: BLUE CROSS/BLUE SHIELD

## 2017-10-23 ENCOUNTER — Ambulatory Visit (INDEPENDENT_AMBULATORY_CARE_PROVIDER_SITE_OTHER): Payer: BLUE CROSS/BLUE SHIELD | Admitting: Obstetrics and Gynecology

## 2017-10-23 ENCOUNTER — Encounter: Payer: Self-pay | Admitting: Obstetrics and Gynecology

## 2017-10-23 ENCOUNTER — Telehealth: Payer: Self-pay

## 2017-10-23 VITALS — BP 110/73 | HR 92 | Wt 169.3 lb

## 2017-10-23 DIAGNOSIS — Z319 Encounter for procreative management, unspecified: Secondary | ICD-10-CM

## 2017-10-23 NOTE — Progress Notes (Signed)
Patient is in the office to follow up from lab work. Pt states LMP 10-01-17.

## 2017-10-23 NOTE — Progress Notes (Signed)
Patient ID: Mariah ObeyHiba Joyce Hey, female   DOB: 12/10/1980, 37 y.o.   MRN: [id number] Pt presents for follow up of her test results. Did not ovulate with last cycle Reviewed test results and tx options with pt.  PE AF VSS Lungs clear Heart RRR  A/P Desire for pregnancy Will refer to REI for further evaluation.  Interrupter services used for today's visit 50 % of time was spent face to face during today's appt

## 2017-10-23 NOTE — Telephone Encounter (Signed)
Called pt via Arabic interpreter, to discuss lab results. No answer, left vm to call.

## 2018-03-19 ENCOUNTER — Inpatient Hospital Stay (HOSPITAL_COMMUNITY)
Admission: AD | Admit: 2018-03-19 | Discharge: 2018-03-19 | Disposition: A | Discharge status: Home / Self Care | Payer: BLUE CROSS/BLUE SHIELD | Source: Ambulatory Visit | Attending: Obstetrics and Gynecology | Admitting: Obstetrics and Gynecology

## 2018-03-19 ENCOUNTER — Encounter (HOSPITAL_COMMUNITY): Payer: Self-pay | Admitting: *Deleted

## 2018-03-19 DIAGNOSIS — O26891 Other specified pregnancy related conditions, first trimester: Secondary | ICD-10-CM | POA: Diagnosis present

## 2018-03-19 DIAGNOSIS — B373 Candidiasis of vulva and vagina: Secondary | ICD-10-CM | POA: Diagnosis not present

## 2018-03-19 DIAGNOSIS — O98811 Other maternal infectious and parasitic diseases complicating pregnancy, first trimester: Secondary | ICD-10-CM | POA: Diagnosis not present

## 2018-03-19 DIAGNOSIS — N898 Other specified noninflammatory disorders of vagina: Secondary | ICD-10-CM | POA: Diagnosis present

## 2018-03-19 DIAGNOSIS — Z3A08 8 weeks gestation of pregnancy: Secondary | ICD-10-CM | POA: Diagnosis not present

## 2018-03-19 DIAGNOSIS — O98819 Other maternal infectious and parasitic diseases complicating pregnancy, unspecified trimester: Secondary | ICD-10-CM

## 2018-03-19 LAB — WET PREP, GENITAL
CLUE CELLS WET PREP: NONE SEEN
Sperm: NONE SEEN
Trich, Wet Prep: NONE SEEN

## 2018-03-19 MED ORDER — TERCONAZOLE 0.4 % VA CREA: 1.0000 | TOPICAL_CREAM | Freq: Every day | VAGINAL | 0 refills | Status: DC

## 2018-03-19 NOTE — Discharge Instructions (Signed)
Vaginal Yeast infection, Adult °Vaginal yeast infection is a condition that causes soreness, swelling, and redness (inflammation) of the vagina. It also causes vaginal discharge. This is a common condition. Some women get this infection frequently. °What are the causes? °This condition is caused by a change in the normal balance of the yeast (candida) and bacteria that live in the vagina. This change causes an overgrowth of yeast, which causes the inflammation. °What increases the risk? °This condition is more likely to develop in: °· Women who take antibiotic medicines. °· Women who have diabetes. °· Women who take birth control pills. °· Women who are pregnant. °· Women who douche often. °· Women who have a weak defense (immune) system. °· Women who have been taking steroid medicines for a long time. °· Women who frequently wear tight clothing. ° °What are the signs or symptoms? °Symptoms of this condition include: °· White, thick vaginal discharge. °· Swelling, itching, redness, and irritation of the vagina. The lips of the vagina (vulva) may be affected as well. °· Pain or a burning feeling while urinating. °· Pain during sex. ° °How is this diagnosed? °This condition is diagnosed with a medical history and physical exam. This will include a pelvic exam. Your health care provider will examine a sample of your vaginal discharge under a microscope. Your health care provider may send this sample for testing to confirm the diagnosis. °How is this treated? °This condition is treated with medicine. Medicines may be over-the-counter or prescription. You may be told to use one or more of the following: °· Medicine that is taken orally. °· Medicine that is applied as a cream. °· Medicine that is inserted directly into the vagina (suppository). ° °Follow these instructions at home: °· Take or apply over-the-counter and prescription medicines only as told by your health care provider. °· Do not have sex until your health  care provider has approved. Tell your sex partner that you have a yeast infection. That person should go to his or her health care provider if he or she develops symptoms. °· Do not wear tight clothes, such as pantyhose or tight pants. °· Avoid using tampons until your health care provider approves. °· Eat more yogurt. This may help to keep your yeast infection from returning. °· Try taking a sitz bath to help with discomfort. This is a warm water bath that is taken while you are sitting down. The water should only come up to your hips and should cover your buttocks. Do this 3-4 times per day or as told by your health care provider. °· Do not douche. °· Wear breathable, cotton underwear. °· If you have diabetes, keep your blood sugar levels under control. °Contact a health care provider if: °· You have a fever. °· Your symptoms go away and then return. °· Your symptoms do not get better with treatment. °· Your symptoms get worse. °· You have new symptoms. °· You develop blisters in or around your vagina. °· You have blood coming from your vagina and it is not your menstrual period. °· You develop pain in your abdomen. °This information is not intended to replace advice given to you by your health care provider. Make sure you discuss any questions you have with your health care provider. °Document Released: 01/18/2005 Document Revised: 09/22/2015 Document Reviewed: 10/12/2014 °Elsevier Interactive Patient Education © 2018 [employer] ° °Safe Medications in Pregnancy  ° °Acne: °Benzoyl Peroxide °Salicylic Acid ° °Backache/Headache: °Tylenol: 2 regular strength every 4 hours   OR °             2 Extra strength every 6 hours ° °Colds/Coughs/Allergies: °Benadryl (alcohol free) 25 mg every 6 hours as needed °Breath right strips °Claritin °Cepacol throat lozenges °Chloraseptic throat spray °Cold-Eeze- up to three times per day °Cough drops, alcohol free °Flonase (by prescription only) °Guaifenesin °Mucinex °Robitussin DM  (plain only, alcohol free) °Saline nasal spray/drops °Sudafed (pseudoephedrine) & Actifed ** use only after [redacted] weeks gestation and if you do not have high blood pressure °Tylenol °Vicks Vaporub °Zinc lozenges °Zyrtec  ° °Constipation: °Colace °Ducolax suppositories °Fleet enema °Glycerin suppositories °Metamucil °Milk of magnesia °Miralax °Senokot °Smooth move tea ° °Diarrhea: °Kaopectate °Imodium A-D ° °*NO pepto Bismol ° °Hemorrhoids: °Anusol °Anusol HC °Preparation H °Tucks ° °Indigestion: °Tums °Maalox °Mylanta °Zantac  °Pepcid ° °Insomnia: °Benadryl (alcohol free) 25mg every 6 hours as needed °Tylenol PM °Unisom, no Gelcaps ° °Leg Cramps: °Tums °MagGel ° °Nausea/Vomiting:  °Bonine °Dramamine °Emetrol °Ginger extract °Sea bands °Meclizine  °Nausea medication to take during pregnancy:  °Unisom (doxylamine succinate 25 mg tablets) Take one tablet daily at bedtime. If symptoms are not adequately controlled, the dose can be increased to a maximum recommended dose of two tablets daily (1/2 tablet in the morning, 1/2 tablet mid-afternoon and one at bedtime). °Vitamin B6 100mg tablets. Take one tablet twice a day (up to 200 mg per day). ° °Skin Rashes: °Aveeno products °Benadryl cream or 25mg every 6 hours as needed °Calamine Lotion °1% cortisone cream ° °Yeast infection: °Gyne-lotrimin 7 °Monistat 7 ° ° °**If taking multiple medications, please check labels to avoid duplicating the same active ingredients °**take medication as directed on the label °** Do not exceed 4000 mg of tylenol in 24 hours °**Do not take medications that contain aspirin or ibuprofen ° ° ° ° °

## 2018-03-19 NOTE — MAU Note (Signed)
Pt reports vaginal itching and discharge. Denies vaginal bleeding or abd pain.

## 2018-03-19 NOTE — MAU Provider Note (Signed)
History     CSN: [id number]  Arrival date and time: 03/19/18 1014   First Provider Initiated Contact with Patient 03/19/18 1124      Chief Complaint  Patient presents with  . Vaginal Itching  . Vaginal Discharge   HPI Mariah Joyce is a 37 y.o. [id number] at 6514w3d who presents with vaginal dischareg. She states it is thick and white and is causing vaginal itching and irritation. She denies any vaginal bleeding. Denies any pain. [facility] and is unsure where she will go for prenatal care.   OB History    Gravida  5   Para  3   Term  3   Preterm  0   AB  1   Living  3     SAB  1   TAB  0   Ectopic  0   Multiple  0   Live Births  3           Past Medical History:  Diagnosis Date  . Complication of anesthesia   . GDM (gestational diabetes mellitus)   . Medical history non-contributory     Past Surgical History:  Procedure Laterality Date  . CESAREAN SECTION     for non-reassuring fhr    No family history on file.  Social History   Tobacco Use  . Smoking status: Never Smoker  . Smokeless tobacco: Never Used  Substance Use Topics  . Alcohol use: No  . Drug use: No    Allergies: No Known Allergies  Medications Prior to Admission  Medication Sig Dispense Refill Last Dose  . Prenatal Vit-Fe Fum-FA-Omega (ONE-A-DAY WOMENS PRENATAL PO) Take by mouth.   Not Taking    Review of Systems  Constitutional: Negative.  Negative for fatigue and fever.  HENT: Negative.   Respiratory: Negative.  Negative for shortness of breath.   Cardiovascular: Negative.  Negative for chest pain.  Gastrointestinal: Negative.  Negative for abdominal pain, constipation, diarrhea, nausea and vomiting.  Genitourinary: Positive for vaginal discharge. Negative for dysuria.  Neurological: Negative.  Negative for dizziness and headaches.   Physical Exam   Blood pressure 109/72, pulse 86, temperature 98.4 F  (36.9 C), temperature source Oral, resp. rate 16, height 5' (1.524 m), weight 75.3 kg, last menstrual period 01/19/2018, SpO2 100 %.  Physical Exam  Nursing note and vitals reviewed. Constitutional: She is oriented to person, place, and time. She appears well-developed and well-nourished. No distress.  HENT:  Head: Normocephalic.  Eyes: Pupils are equal, round, and reactive to light.  Cardiovascular: Normal rate, regular rhythm and normal heart sounds.  Respiratory: Effort normal and breath sounds normal. No respiratory distress.  GI: Soft. Bowel sounds are normal. She exhibits no distension. There is no tenderness.  Genitourinary: Vaginal discharge found.  Neurological: She is alert and oriented to person, place, and time.  Skin: Skin is warm and dry.  Psychiatric: She has a normal mood and affect. Her behavior is normal. Judgment and thought content normal.    MAU Course  Procedures Results for orders placed or performed during the hospital encounter of 03/19/18 (from the past 24 hour(s))  Wet prep, genital     Status: Abnormal   Collection Time: 03/19/18 10:43 AM  Result Value Ref Range   Yeast Wet Prep HPF POC PRESENT (A) NONE SEEN   Trich, Wet Prep NONE SEEN NONE SEEN   Clue Cells Wet Prep HPF POC NONE SEEN NONE SEEN  WBC, Wet Prep HPF POC FEW (A) NONE SEEN   Sperm NONE SEEN    MDM Wet prep and gc/chlamydia  Assessment and Plan   1. Candidiasis of vagina during pregnancy   2. [redacted] weeks gestation of pregnancy    -Discharge home in stable condition -Rx for terazol sent to patient's pharmacy -Patient advised to follow-up with OB of choice to start prenatal care -Patient may return to MAU as needed or if her condition were to change or worsen  Rolm Bookbinder CNM 03/19/2018, 11:24 AM

## 2018-03-20 ENCOUNTER — Encounter (HOSPITAL_COMMUNITY): Payer: Self-pay

## 2018-03-20 LAB — GC/CHLAMYDIA PROBE AMP (~~LOC~~) NOT AT ARMC
CHLAMYDIA, DNA PROBE: NEGATIVE
NEISSERIA GONORRHEA: NEGATIVE

## 2018-04-02 DIAGNOSIS — R112 Nausea with vomiting, unspecified: Secondary | ICD-10-CM | POA: Diagnosis not present

## 2018-04-02 DIAGNOSIS — N925 Other specified irregular menstruation: Secondary | ICD-10-CM | POA: Diagnosis not present

## 2018-04-02 DIAGNOSIS — Z3A01 Less than 8 weeks gestation of pregnancy: Secondary | ICD-10-CM | POA: Diagnosis not present

## 2018-04-02 DIAGNOSIS — O3680X9 Pregnancy with inconclusive fetal viability, other fetus: Secondary | ICD-10-CM | POA: Diagnosis not present

## 2018-04-02 DIAGNOSIS — Z113 Encounter for screening for infections with a predominantly sexual mode of transmission: Secondary | ICD-10-CM | POA: Diagnosis not present

## 2018-04-02 DIAGNOSIS — R4689 Other symptoms and signs involving appearance and behavior: Secondary | ICD-10-CM | POA: Diagnosis not present

## 2018-04-24 NOTE — L&D Delivery Note (Signed)
Delivery Note At  a viable female was delivered via  (Presentation:ROA ;  ).  APGAR:8 ,9 ; weight pending  .   Placenta status:complete , . 3V Cord:  with the following complications:None .  Cord pH:   Anesthesia:  Epidural Episiotomy:   Right Mediolateral Lacerations:  clitoral hood Suture Repair: 3.0 vicryl 4-0 vicryl Est. Blood Loss (mL):   400cc  Mom to postpartum.  Baby to Couplet care / Skin to Skin.  Pleas Koch Prothero 09/29/2018, 5:54 PM

## 2018-05-03 DIAGNOSIS — O09512 Supervision of elderly primigravida, second trimester: Secondary | ICD-10-CM | POA: Diagnosis not present

## 2018-05-03 DIAGNOSIS — Z3482 Encounter for supervision of other normal pregnancy, second trimester: Secondary | ICD-10-CM | POA: Diagnosis not present

## 2018-05-03 DIAGNOSIS — Z3A14 14 weeks gestation of pregnancy: Secondary | ICD-10-CM | POA: Diagnosis not present

## 2018-05-24 ENCOUNTER — Encounter: Payer: BLUE CROSS/BLUE SHIELD | Attending: Obstetrics and Gynecology | Admitting: *Deleted

## 2018-05-24 DIAGNOSIS — E1165 Type 2 diabetes mellitus with hyperglycemia: Secondary | ICD-10-CM | POA: Diagnosis not present

## 2018-05-24 DIAGNOSIS — O24112 Pre-existing diabetes mellitus, type 2, in pregnancy, second trimester: Secondary | ICD-10-CM | POA: Diagnosis not present

## 2018-05-24 NOTE — Progress Notes (Signed)
Patient was seen on 05/24/2018 for type 2 Diabetes and pregnancy self-management. EDD 10/26/2018. Patient here with husband who stayed for part of the visit as well as Arabic interpretor. Patient states she was unaware of her history of this diabetes.  Diet history obtained. Patient eats good variety of all food groups and beverages include water and milk. Patient is currently on no diabetes medications.  The following learning objectives were met by the patient :   States the definition of Diabetes and pregnancy   States why dietary management is important in controlling blood glucose  Describes the effects of carbohydrates on blood glucose levels  Demonstrates ability to create a balanced meal plan  Demonstrates carbohydrate counting   States when to check blood glucose levels  Demonstrates proper blood glucose monitoring techniques  States the effect of stress and exercise on blood glucose levels  States the importance of limiting caffeine and abstaining from alcohol and smoking  Insulin Instruction   Insulin Action of Humalog and PNPH insulins  Reviewed syringe & vial including # units per syringe,    length of needles  Hygiene and storage  Drawing up single and mixed doses if using vials   Single dose   Mixed dose:   Rotation of Sites  Hypoglycemia- symptoms, causes , treatment choices  Record keeping and MD follow up   Patient demonstrated understanding of insulin administration by return demonstration.  Plan:   Aim for 3 Carb Choices per meal (45 grams) +/- 1 either way   Aim for 1-2 Carbs per snack  Begin reading food labels for Total Carbohydrate of foods  Consider  increasing your activity level by walking or other activity daily as tolerated  Begin checking BG before breakfast and 2 hours after first bite of breakfast, lunch and dinner as directed by MD   Bring Log Book/Sheet to every medical appointment    Take medication as directed by MD  Blood glucose  monitor called to MD office so they can call Rx into pharmacy: Coats with Fast Clix drums Patient instructed to test pre breakfast and 2 hours each meal as directed by MD  Patient instructed to monitor glucose levels: FBS: 60 - 95 mg/dl 2 hour: <120 mg/dl  Patient received the following handouts: in Arabic  Nutrition Diabetes and Pregnancy  Carbohydrate Counting List  And the following insulin handouts:   Insulin Instruction Handout   Patient will be seen for follow-up as needed.

## 2018-05-31 DIAGNOSIS — Z3A18 18 weeks gestation of pregnancy: Secondary | ICD-10-CM | POA: Diagnosis not present

## 2018-05-31 DIAGNOSIS — Z3492 Encounter for supervision of normal pregnancy, unspecified, second trimester: Secondary | ICD-10-CM | POA: Diagnosis not present

## 2018-05-31 DIAGNOSIS — E119 Type 2 diabetes mellitus without complications: Secondary | ICD-10-CM | POA: Diagnosis not present

## 2018-05-31 DIAGNOSIS — Z363 Encounter for antenatal screening for malformations: Secondary | ICD-10-CM | POA: Diagnosis not present

## 2018-06-21 DIAGNOSIS — Z369 Encounter for antenatal screening, unspecified: Secondary | ICD-10-CM | POA: Diagnosis not present

## 2018-06-21 DIAGNOSIS — Z3A21 21 weeks gestation of pregnancy: Secondary | ICD-10-CM | POA: Diagnosis not present

## 2018-06-21 DIAGNOSIS — O24119 Pre-existing diabetes mellitus, type 2, in pregnancy, unspecified trimester: Secondary | ICD-10-CM | POA: Diagnosis not present

## 2018-08-01 DIAGNOSIS — Z3492 Encounter for supervision of normal pregnancy, unspecified, second trimester: Secondary | ICD-10-CM | POA: Diagnosis not present

## 2018-08-01 DIAGNOSIS — Z369 Encounter for antenatal screening, unspecified: Secondary | ICD-10-CM | POA: Diagnosis not present

## 2018-08-01 DIAGNOSIS — Z3A27 27 weeks gestation of pregnancy: Secondary | ICD-10-CM | POA: Diagnosis not present

## 2018-08-01 DIAGNOSIS — O24419 Gestational diabetes mellitus in pregnancy, unspecified control: Secondary | ICD-10-CM | POA: Diagnosis not present

## 2018-08-12 DIAGNOSIS — Z3A29 29 weeks gestation of pregnancy: Secondary | ICD-10-CM | POA: Diagnosis not present

## 2018-08-12 DIAGNOSIS — E119 Type 2 diabetes mellitus without complications: Secondary | ICD-10-CM | POA: Diagnosis not present

## 2018-08-12 DIAGNOSIS — K219 Gastro-esophageal reflux disease without esophagitis: Secondary | ICD-10-CM | POA: Diagnosis not present

## 2018-08-29 DIAGNOSIS — O24919 Unspecified diabetes mellitus in pregnancy, unspecified trimester: Secondary | ICD-10-CM | POA: Diagnosis not present

## 2018-08-29 DIAGNOSIS — O24419 Gestational diabetes mellitus in pregnancy, unspecified control: Secondary | ICD-10-CM | POA: Diagnosis not present

## 2018-08-29 DIAGNOSIS — Z3A31 31 weeks gestation of pregnancy: Secondary | ICD-10-CM | POA: Diagnosis not present

## 2018-08-29 DIAGNOSIS — Z3492 Encounter for supervision of normal pregnancy, unspecified, second trimester: Secondary | ICD-10-CM | POA: Diagnosis not present

## 2018-09-05 DIAGNOSIS — O09899 Supervision of other high risk pregnancies, unspecified trimester: Secondary | ICD-10-CM | POA: Diagnosis not present

## 2018-09-05 DIAGNOSIS — O24419 Gestational diabetes mellitus in pregnancy, unspecified control: Secondary | ICD-10-CM | POA: Diagnosis not present

## 2018-09-05 DIAGNOSIS — Z3493 Encounter for supervision of normal pregnancy, unspecified, third trimester: Secondary | ICD-10-CM | POA: Diagnosis not present

## 2018-09-05 DIAGNOSIS — Z3A32 32 weeks gestation of pregnancy: Secondary | ICD-10-CM | POA: Diagnosis not present

## 2018-09-11 DIAGNOSIS — Z3A33 33 weeks gestation of pregnancy: Secondary | ICD-10-CM | POA: Diagnosis not present

## 2018-09-11 DIAGNOSIS — Z3493 Encounter for supervision of normal pregnancy, unspecified, third trimester: Secondary | ICD-10-CM | POA: Diagnosis not present

## 2018-09-11 DIAGNOSIS — O24419 Gestational diabetes mellitus in pregnancy, unspecified control: Secondary | ICD-10-CM | POA: Diagnosis not present

## 2018-09-11 DIAGNOSIS — E118 Type 2 diabetes mellitus with unspecified complications: Secondary | ICD-10-CM | POA: Diagnosis not present

## 2018-09-26 DIAGNOSIS — Z3A35 35 weeks gestation of pregnancy: Secondary | ICD-10-CM | POA: Diagnosis not present

## 2018-09-26 DIAGNOSIS — O24419 Gestational diabetes mellitus in pregnancy, unspecified control: Secondary | ICD-10-CM | POA: Diagnosis not present

## 2018-09-26 DIAGNOSIS — Z124 Encounter for screening for malignant neoplasm of cervix: Secondary | ICD-10-CM | POA: Diagnosis not present

## 2018-09-26 DIAGNOSIS — Z369 Encounter for antenatal screening, unspecified: Secondary | ICD-10-CM | POA: Diagnosis not present

## 2018-09-29 ENCOUNTER — Inpatient Hospital Stay (HOSPITAL_COMMUNITY): Payer: BC Managed Care – PPO | Admitting: Anesthesiology

## 2018-09-29 ENCOUNTER — Encounter (HOSPITAL_COMMUNITY): Payer: Self-pay

## 2018-09-29 ENCOUNTER — Other Ambulatory Visit: Payer: Self-pay

## 2018-09-29 ENCOUNTER — Inpatient Hospital Stay (HOSPITAL_COMMUNITY)
Admission: EM | Admit: 2018-09-29 | Discharge: 2018-10-01 | DRG: 805 | Disposition: A | Discharge status: Home / Self Care | Payer: BC Managed Care – PPO | Attending: Obstetrics and Gynecology | Admitting: Obstetrics and Gynecology

## 2018-09-29 DIAGNOSIS — Z03818 Encounter for observation for suspected exposure to other biological agents ruled out: Secondary | ICD-10-CM | POA: Diagnosis not present

## 2018-09-29 DIAGNOSIS — Z3A00 Weeks of gestation of pregnancy not specified: Secondary | ICD-10-CM | POA: Diagnosis not present

## 2018-09-29 DIAGNOSIS — O3483 Maternal care for other abnormalities of pelvic organs, third trimester: Secondary | ICD-10-CM | POA: Diagnosis not present

## 2018-09-29 DIAGNOSIS — Z1159 Encounter for screening for other viral diseases: Secondary | ICD-10-CM

## 2018-09-29 DIAGNOSIS — E119 Type 2 diabetes mellitus without complications: Secondary | ICD-10-CM | POA: Diagnosis present

## 2018-09-29 DIAGNOSIS — Z3A36 36 weeks gestation of pregnancy: Secondary | ICD-10-CM

## 2018-09-29 DIAGNOSIS — O42913 Preterm premature rupture of membranes, unspecified as to length of time between rupture and onset of labor, third trimester: Secondary | ICD-10-CM

## 2018-09-29 DIAGNOSIS — O34219 Maternal care for unspecified type scar from previous cesarean delivery: Secondary | ICD-10-CM

## 2018-09-29 DIAGNOSIS — N90810 Female genital mutilation status, unspecified: Secondary | ICD-10-CM | POA: Diagnosis not present

## 2018-09-29 DIAGNOSIS — O42013 Preterm premature rupture of membranes, onset of labor within 24 hours of rupture, third trimester: Secondary | ICD-10-CM | POA: Diagnosis not present

## 2018-09-29 DIAGNOSIS — O2412 Pre-existing diabetes mellitus, type 2, in childbirth: Secondary | ICD-10-CM | POA: Diagnosis not present

## 2018-09-29 DIAGNOSIS — O4290 Premature rupture of membranes, unspecified as to length of time between rupture and onset of labor, unspecified weeks of gestation: Secondary | ICD-10-CM | POA: Diagnosis present

## 2018-09-29 DIAGNOSIS — Z7984 Long term (current) use of oral hypoglycemic drugs: Secondary | ICD-10-CM

## 2018-09-29 DIAGNOSIS — O24429 Gestational diabetes mellitus in childbirth, unspecified control: Secondary | ICD-10-CM | POA: Diagnosis not present

## 2018-09-29 LAB — GLUCOSE, CAPILLARY: Glucose-Capillary: 156 mg/dL — ABNORMAL HIGH (ref 70–99)

## 2018-09-29 LAB — CBC
HCT: 40.7 % (ref 36.0–46.0)
Hemoglobin: 13.7 g/dL (ref 12.0–15.0)
MCH: 29.4 pg (ref 26.0–34.0)
MCHC: 33.7 g/dL (ref 30.0–36.0)
MCV: 87.3 fL (ref 80.0–100.0)
Platelets: 136 10*3/uL — ABNORMAL LOW (ref 150–400)
RBC: 4.66 MIL/uL (ref 3.87–5.11)
RDW: 14.6 % (ref 11.5–15.5)
WBC: 4.6 10*3/uL (ref 4.0–10.5)
nRBC: 0 % (ref 0.0–0.2)

## 2018-09-29 LAB — TYPE AND SCREEN
ABO/RH(D): O POS
Antibody Screen: NEGATIVE

## 2018-09-29 LAB — SARS CORONAVIRUS 2 BY RT PCR (HOSPITAL ORDER, PERFORMED IN ~~LOC~~ HOSPITAL LAB): SARS Coronavirus 2: NEGATIVE

## 2018-09-29 LAB — GROUP B STREP BY PCR: Group B strep by PCR: NEGATIVE

## 2018-09-29 MED ORDER — SODIUM CHLORIDE (PF) 0.9 % IJ SOLN
INTRAMUSCULAR | Status: DC | PRN
  Administered 2018-09-29: 12 mL/h via EPIDURAL

## 2018-09-29 MED ORDER — OXYTOCIN BOLUS FROM INFUSION
500.0000 mL | Freq: Once | INTRAVENOUS | Status: AC
  Administered 2018-09-29: 500 mL via INTRAVENOUS

## 2018-09-29 MED ORDER — WITCH HAZEL-GLYCERIN EX PADS: 1.0000 "application " | MEDICATED_PAD | CUTANEOUS | Status: DC | PRN

## 2018-09-29 MED ORDER — OXYTOCIN 40 UNITS IN NORMAL SALINE INFUSION - SIMPLE MED
1.0000 m[IU]/min | INTRAVENOUS | Status: DC
  Administered 2018-09-29: 2 m[IU]/min via INTRAVENOUS
  Filled 2018-09-29: qty 1000

## 2018-09-29 MED ORDER — IBUPROFEN 600 MG PO TABS
600.0000 mg | ORAL_TABLET | Freq: Four times a day (QID) | ORAL | Status: DC
  Administered 2018-09-30 – 2018-10-01 (×7): 600 mg via ORAL
  Filled 2018-09-29 (×7): qty 1

## 2018-09-29 MED ORDER — LIDOCAINE HCL (PF) 1 % IJ SOLN
INTRAMUSCULAR | Status: DC | PRN
  Administered 2018-09-29: 10 mL via EPIDURAL

## 2018-09-29 MED ORDER — BENZOCAINE-MENTHOL 20-0.5 % EX AERO: 1.0000 "application " | INHALATION_SPRAY | CUTANEOUS | Status: DC | PRN

## 2018-09-29 MED ORDER — FENTANYL-BUPIVACAINE-NACL 0.5-0.125-0.9 MG/250ML-% EP SOLN
12.0000 mL/h | EPIDURAL | Status: DC | PRN
  Filled 2018-09-29: qty 250

## 2018-09-29 MED ORDER — DIPHENHYDRAMINE HCL 50 MG/ML IJ SOLN: 12.5000 mg | INTRAMUSCULAR | Status: DC | PRN

## 2018-09-29 MED ORDER — ACETAMINOPHEN 325 MG PO TABS: 650.0000 mg | ORAL_TABLET | ORAL | Status: DC | PRN

## 2018-09-29 MED ORDER — ZOLPIDEM TARTRATE 5 MG PO TABS: 5.0000 mg | ORAL_TABLET | Freq: Every evening | ORAL | Status: DC | PRN

## 2018-09-29 MED ORDER — OXYCODONE-ACETAMINOPHEN 5-325 MG PO TABS: 2.0000 | ORAL_TABLET | ORAL | Status: DC | PRN

## 2018-09-29 MED ORDER — TETANUS-DIPHTH-ACELL PERTUSSIS 5-2.5-18.5 LF-MCG/0.5 IM SUSP: 0.5000 mL | Freq: Once | INTRAMUSCULAR | Status: DC

## 2018-09-29 MED ORDER — ONDANSETRON HCL 4 MG/2ML IJ SOLN: 4.0000 mg | Freq: Four times a day (QID) | INTRAMUSCULAR | Status: DC | PRN

## 2018-09-29 MED ORDER — LACTATED RINGERS IV SOLN: 500.0000 mL | INTRAVENOUS | Status: DC | PRN

## 2018-09-29 MED ORDER — DIBUCAINE (PERIANAL) 1 % EX OINT: 1.0000 "application " | TOPICAL_OINTMENT | CUTANEOUS | Status: DC | PRN

## 2018-09-29 MED ORDER — COCONUT OIL OIL: 1.0000 "application " | TOPICAL_OIL | Status: DC | PRN

## 2018-09-29 MED ORDER — SIMETHICONE 80 MG PO CHEW: 80.0000 mg | CHEWABLE_TABLET | ORAL | Status: DC | PRN

## 2018-09-29 MED ORDER — PHENYLEPHRINE 40 MCG/ML (10ML) SYRINGE FOR IV PUSH (FOR BLOOD PRESSURE SUPPORT)
80.0000 ug | PREFILLED_SYRINGE | INTRAVENOUS | Status: DC | PRN
  Filled 2018-09-29: qty 10

## 2018-09-29 MED ORDER — LACTATED RINGERS IV SOLN
INTRAVENOUS | Status: DC
  Administered 2018-09-29 (×2): via INTRAVENOUS

## 2018-09-29 MED ORDER — TERBUTALINE SULFATE 1 MG/ML IJ SOLN: 0.2500 mg | Freq: Once | INTRAMUSCULAR | Status: DC | PRN

## 2018-09-29 MED ORDER — SENNOSIDES-DOCUSATE SODIUM 8.6-50 MG PO TABS
2.0000 | ORAL_TABLET | ORAL | Status: DC
  Administered 2018-09-30 (×2): 2 via ORAL
  Filled 2018-09-29 (×2): qty 2

## 2018-09-29 MED ORDER — LIDOCAINE HCL (PF) 1 % IJ SOLN
30.0000 mL | INTRAMUSCULAR | Status: DC | PRN
  Filled 2018-09-29: qty 30

## 2018-09-29 MED ORDER — EPHEDRINE 5 MG/ML INJ: 10.0000 mg | INTRAVENOUS | Status: DC | PRN

## 2018-09-29 MED ORDER — DIPHENHYDRAMINE HCL 25 MG PO CAPS: 25.0000 mg | ORAL_CAPSULE | Freq: Four times a day (QID) | ORAL | Status: DC | PRN

## 2018-09-29 MED ORDER — PHENYLEPHRINE 40 MCG/ML (10ML) SYRINGE FOR IV PUSH (FOR BLOOD PRESSURE SUPPORT): 80.0000 ug | PREFILLED_SYRINGE | INTRAVENOUS | Status: DC | PRN

## 2018-09-29 MED ORDER — ONDANSETRON HCL 4 MG PO TABS: 4.0000 mg | ORAL_TABLET | ORAL | Status: DC | PRN

## 2018-09-29 MED ORDER — SOD CITRATE-CITRIC ACID 500-334 MG/5ML PO SOLN: 30.0000 mL | ORAL | Status: DC | PRN

## 2018-09-29 MED ORDER — ONDANSETRON HCL 4 MG/2ML IJ SOLN: 4.0000 mg | INTRAMUSCULAR | Status: DC | PRN

## 2018-09-29 MED ORDER — LACTATED RINGERS IV SOLN
500.0000 mL | Freq: Once | INTRAVENOUS | Status: AC
  Administered 2018-09-29: 500 mL via INTRAVENOUS

## 2018-09-29 MED ORDER — OXYTOCIN 40 UNITS IN NORMAL SALINE INFUSION - SIMPLE MED
2.5000 [IU]/h | INTRAVENOUS | Status: DC
  Administered 2018-09-29: 2.5 [IU]/h via INTRAVENOUS

## 2018-09-29 MED ORDER — OXYCODONE-ACETAMINOPHEN 5-325 MG PO TABS: 1.0000 | ORAL_TABLET | ORAL | Status: DC | PRN

## 2018-09-29 MED ORDER — PRENATAL MULTIVITAMIN CH
1.0000 | ORAL_TABLET | Freq: Every day | ORAL | Status: DC
  Administered 2018-09-30 – 2018-10-01 (×2): 1 via ORAL
  Filled 2018-09-29 (×2): qty 1

## 2018-09-29 NOTE — ED Notes (Signed)
Pt is 36 wk 1 day with clear fluid leaking, more noted with vaginal exam, no contractions although she has felt the baby move as normal. [facility] has been notified.

## 2018-09-29 NOTE — Progress Notes (Signed)
Pt is a G5P3 at 36 1/[redacted] weeks gestation presenting with c/o leaking fluid  Since 0300 this morning. Pt gets her care at [place]. No vaginal bleeding. Pt has type 2 gestational diabetes. She has had one vaginal delivery, one C/S, and another vaginal birth. Pt and husband desire a  VBAC.

## 2018-09-29 NOTE — ED Notes (Signed)
[facility] is here and is loading pt. As I write this. They agree with plan to tx to [facility].

## 2018-09-29 NOTE — ED Provider Notes (Signed)
[facility] Emergency Department Provider Note MRN:  [id number]  Arrival date & time: 09/29/18     Chief Complaint   Leakage of fluid (pregnant) History of Present Illness   Mariah Joyce is a 38 y.o. year-old female with no pertinent past medical history presenting to the ED with chief complaint of leakage of fluid.  Patient is confident that her water broke starting last night at 2 AM.  History obtained from patient's husband.  Intermittent leakage of fluid since that time, happening more frequently when she walks or moves.  Clear fluid, no vaginal bleeding, no abdominal pain, no contractions, no other symptoms.  Patient is 36 weeks and 1 day pregnant.  Fourth pregnancy, no complications with current pregnancy.  Review of Systems  A complete 10 system review of systems was obtained and all systems are negative except as noted in the HPI and PMH.   Patient's Health History    Past Medical History:  Diagnosis Date  . Complication of anesthesia   . GDM (gestational diabetes mellitus)   . Medical history non-contributory     Past Surgical History:  Procedure Laterality Date  . CESAREAN SECTION     for non-reassuring fhr    No family history on file.  Social History   Socioeconomic History  . Marital status: Married    Spouse name: Not on file  . Number of children: Not on file  . Years of education: Not on file  . Highest education level: Not on file  Occupational History  . Not on file  Social Needs  . Financial resource strain: Not on file  . Food insecurity:    Worry: Not on file    Inability: Not on file  . Transportation needs:    Medical: Not on file    Non-medical: Not on file  Tobacco Use  . Smoking status: Never Smoker  . Smokeless tobacco: Never Used  Substance and Sexual Activity  . Alcohol use: No  . Drug use: No  . Sexual activity: Yes  Lifestyle  . Physical activity:    Days per week: Not on file    Minutes per  session: Not on file  . Stress: Not on file  Relationships  . Social connections:    Talks on phone: Not on file    Gets together: Not on file    Attends religious service: Not on file    Active member of club or organization: Not on file    Attends meetings of clubs or organizations: Not on file    Relationship status: Not on file  . Intimate partner violence:    Fear of current or ex partner: Not on file    Emotionally abused: Not on file    Physically abused: Not on file    Forced sexual activity: Not on file  Other Topics Concern  . Not on file  Social History Narrative  . Not on file     Physical Exam  Vital Signs and Nursing Notes reviewed Vitals:   09/29/18 0902 09/29/18 0930  BP: 107/81 109/80  Pulse: 99 72  Resp: 16   Temp: 98.4 F (36.9 C)   SpO2: 97% 98%    CONSTITUTIONAL: Well-appearing, NAD NEURO:  Alert and oriented x 3, no focal deficits EYES:  eyes equal and reactive ENT/NECK:  no LAD, no JVD CARDIO: Regular rate, well-perfused, normal S1 and S2 PULM:  CTAB no wheezing or rhonchi GI/GU:  normal bowel sounds, gravid, non-tender; no  vaginal or cervical tenderness, no gross blood, cervix closed MSK/SPINE:  No gross deformities, no edema SKIN:  no rash, atraumatic PSYCH:  Appropriate speech and behavior  Diagnostic and Interventional Summary    Labs Reviewed  SARS CORONAVIRUS 2 (HOSPITAL ORDER, PERFORMED IN  HOSPITAL LAB)    No orders to display    Medications - No data to display   Procedures Critical Care  ED Course and Medical Decision Making  I have reviewed the triage vital signs and the nursing notes.  Pertinent labs & imaging results that were available during my care of the patient were reviewed by me and considered in my medical decision making (see below for details).  Premature rupture of membranes in this 38 year old female, presumed leaking of amniotic fluid based on history.  Normal vital signs, fetal heart rate is  normal in the 50s.  Patient is without contractions, no pain, cervix seems closed on my exam, OB nurse in route to determine management plan from here.  Evaluated by rapid OB nurse, patient is 1 cm dilated, largely no effacement, but there is concern for premature rupture of membranes and they recommend transfer to women's center for further care.  Elmer SowMichael M. Pilar PlateBero, MD [facility] [email or web address]  Final Clinical Impressions(s) / ED Diagnoses     ICD-10-CM   1. Preterm premature rupture of membranes in third trimester, unspecified duration to onset of labor O42.913     ED Discharge Orders    None         Sabas SousBero, Michael M, MD 09/29/18 1035

## 2018-09-29 NOTE — ED Triage Notes (Signed)
Pt states last night a little water coming out vaginally.  [redacted] weeks pregnant. No contractions. Pt states she feels baby moving.   Pre natal care by [place] (by [employer])

## 2018-09-29 NOTE — Progress Notes (Signed)
Spoke with Dr. Charlesetta Garibaldi. PT is a G5P3 at 48 1/[redacted] weeks gestation here because she has been leaking clear fluid since 0300 this morning. FHR tracing is a category 1 with some uc's. Pt is not complaining of pain. Cervix is 1cm, thk, presenting part at a -2 station. Presenting feels hard like a head, but she is only 1cm.Pt can be transferred to [facility], L&D, room 202.

## 2018-09-29 NOTE — Progress Notes (Signed)
Educated patient and support person on late preterm infant protocol. Set mother up with DEBP and she started pumping on left breast. She refused to pump on both sides. Hand expression was taught on right breast but patient did not want to complete hand expression session. Patient and support person showed understanding and know to call if they need assistance with latching.   Roberts Gaudy, RN

## 2018-09-29 NOTE — Anesthesia Preprocedure Evaluation (Signed)
Anesthesia Evaluation  Patient identified by MRN, date of birth, ID band Patient awake    Reviewed: Allergy & Precautions, H&P , NPO status , Patient's Chart, lab work & pertinent test results  History of Anesthesia Complications Negative for: history of anesthetic complications  Airway Mallampati: II  TM Distance: >3 FB Neck ROM: full    Dental no notable dental hx.    Pulmonary neg pulmonary ROS,    Pulmonary exam normal        Cardiovascular negative cardio ROS Normal cardiovascular exam Rhythm:regular Rate:Normal     Neuro/Psych negative neurological ROS  negative psych ROS   GI/Hepatic negative GI ROS, Neg liver ROS,   Endo/Other  diabetes, Gestational  Renal/GU negative Renal ROS  negative genitourinary   Musculoskeletal   Abdominal   Peds  Hematology negative hematology ROS (+)   Anesthesia Other Findings   Reproductive/Obstetrics (+) Pregnancy                             Anesthesia Physical Anesthesia Plan  ASA: II  Anesthesia Plan: Epidural   Post-op Pain Management:    Induction:   PONV Risk Score and Plan:   Airway Management Planned:   Additional Equipment:   Intra-op Plan:   Post-operative Plan:   Informed Consent: I have reviewed the patients History and Physical, chart, labs and discussed the procedure including the risks, benefits and alternatives for the proposed anesthesia with the patient or authorized representative who has indicated his/her understanding and acceptance.       Plan Discussed with:   Anesthesia Plan Comments:         Anesthesia Quick Evaluation  

## 2018-09-29 NOTE — ED Notes (Signed)
Our C.N., Mariah Joyce has assisted Dr. Sedonia Small with her exam. And upon her arrival we notified the R.O.B. nurse, Mariah Joyce) who is en route. Pt. Is with her significant other and in no distress. She does not feel like she is contracting.

## 2018-09-29 NOTE — H&P (Signed)
Mariah Joyce is a 38 y.o. female, G5P3013 at 36.1 weeks, presenting for SROM clear fluid a 0300t.  Previous hx of vbac in 2015.  Has had a female circumcision.  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  Patient Active Problem List   Diagnosis Date Noted  . Delayed delivery after SROM (spontaneous rupture of membranes) 09/29/2018  . Visit for routine gyn exam 09/26/2017  . Desire for pregnancy 09/26/2017  . Exposure to sexually transmitted disease (STD) 09/26/2017  . 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 H/O vaginal delivery prior to C/S 03/11/2014  . Language barrier 03/10/2014  . Obesity 03/10/2014  . History of gestational diabetes mellitus (GDM) 03/10/2014    History of present pregnancy: Patient entered care at 14.3 weeks.   EDC of 10/26/2018 was established by LMP.   Anatomy scan:  18.6 weeks, with normal findings and an anterior placenta.   Additional [place] evaluations:  Monthly for growth   Significant prenatal events:   Last evaluation: This week  OB History    Gravida  5   Para  3   Term  3   Preterm  0   AB  1   Living  3     SAB  1   TAB  0   Ectopic  0   Multiple  0   Live Births  3          Past Medical History:  Diagnosis Date  . Complication of anesthesia   . GDM (gestational diabetes mellitus)   . Medical history non-contributory    Past Surgical History:  Procedure Laterality Date  . CESAREAN SECTION     for non-reassuring fhr   Family History: family history is not on file. Social History:  reports that she has never smoked. She has never used smokeless tobacco. She reports that she does not drink alcohol or use drugs.   Prenatal Transfer Tool  Maternal Diabetes: Yes:  Diabetes Type:  Pre-pregnancy Genetic Screening: Normal Maternal Ultrasounds/Referrals: Normal Fetal Ultrasounds or other Referrals:  None Maternal Substance Abuse:  No Significant Maternal Medications:  Meds include: Other: Glyburide  2.5mg  Significant Maternal Lab Results: Lab values include: Other:   TDAP   ROS:  All ten systems reviewed and negative except as stated above  No Known Allergies   Dilation: 1 Effacement (%): 50 Station: -2 Exam by:: N Prothero Blood pressure 99/67, pulse 78, temperature 98.1 F (36.7 C), temperature source Oral, resp. rate 16, height 5\' 2"  (1.575 m), weight 75.3 kg, last menstrual period 01/19/2018, SpO2 98 %.  Chest clear Heart RRR without murmur Abd gravid, NT, FH appropriate Pelvic: adequate Ext: +2/+2  FHR: Category 1 FHT 150 minimal variability UCs:  Irregular  Prenatal labs: ABO, Rh:  O pos Antibody:  NR Rubella:   IMM RPR:   NR HBsAg:   Neg HIV:   NR GBS:  sent Sickle cell/Hgb electrophoresis:  AA  GC:  Neg Chlamydia: Neg Genetic screenings: Neg Glucola:  Hgb A1c 6.5 Other:   Hgb 12.8 at NOB, 12.4 at 28 weeks       Assessment/Plan: IUP at 36.1 IUP with SROM clear clear at 0300 Reassuring fetal status  Plan: Admit to [facility]  Routine [facility] orders Pain med/epidural prn PCN G for GBS prophylaxis if positive GBS PCR Pitocin augmentation  Pleas Koch ProtheroCNM, MSN 09/29/2018, 11:34 AM

## 2018-09-29 NOTE — ED Notes (Signed)
[facility] has received report and is on the way.

## 2018-09-29 NOTE — Progress Notes (Signed)
Spoke with L&D charge nurse Carleene Overlie. Pt is grossly ruptured and will be a direct admit. Pt can go to room 202.

## 2018-09-29 NOTE — Anesthesia Procedure Notes (Signed)
Epidural Patient location during procedure: OB Start time: 09/29/2018 2:10 PM End time: 09/29/2018 2:27 PM  Staffing Anesthesiologist: Lidia Collum, MD Performed: anesthesiologist   Preanesthetic Checklist Completed: patient identified, pre-op evaluation, timeout performed, IV checked, risks and benefits discussed and monitors and equipment checked  Epidural Patient position: sitting Prep: DuraPrep Patient monitoring: heart rate, continuous pulse ox and blood pressure Approach: midline Location: L3-L4 Injection technique: LOR air  Needle:  Needle type: Tuohy  Needle gauge: 17 G Needle length: 9 cm Needle insertion depth: 5 cm Catheter type: closed end flexible Catheter size: 19 Gauge Catheter at skin depth: 10 cm Test dose: negative  Assessment Events: blood not aspirated, injection not painful, no injection resistance, negative IV test and no paresthesia  Additional Notes Reason for block:procedure for pain

## 2018-09-30 ENCOUNTER — Encounter (HOSPITAL_COMMUNITY): Payer: Self-pay | Admitting: Obstetrics and Gynecology

## 2018-09-30 LAB — ABO/RH: ABO/RH(D): O POS

## 2018-09-30 LAB — CBC
HCT: 38.7 % (ref 36.0–46.0)
Hemoglobin: 12.9 g/dL (ref 12.0–15.0)
MCH: 29.5 pg (ref 26.0–34.0)
MCHC: 33.3 g/dL (ref 30.0–36.0)
MCV: 88.6 fL (ref 80.0–100.0)
Platelets: 115 10*3/uL — ABNORMAL LOW (ref 150–400)
RBC: 4.37 MIL/uL (ref 3.87–5.11)
RDW: 14.4 % (ref 11.5–15.5)
WBC: 7.6 10*3/uL (ref 4.0–10.5)
nRBC: 0 % (ref 0.0–0.2)

## 2018-09-30 LAB — GLUCOSE, CAPILLARY
Glucose-Capillary: 70 mg/dL (ref 70–99)
Glucose-Capillary: 91 mg/dL (ref 70–99)
Glucose-Capillary: 140 mg/dL — ABNORMAL HIGH (ref 70–99)
Glucose-Capillary: 183 mg/dL — ABNORMAL HIGH (ref 70–99)

## 2018-09-30 LAB — RPR: RPR Ser Ql: NONREACTIVE

## 2018-09-30 NOTE — Lactation Note (Signed)
This note was copied from a baby's chart. Lactation Consultation Note  Patient Name: Mariah Joyce Date: 09/30/2018   I spoke with Catheryn Bacon, RN about infant's feeding volumes. RN was provided Enfamil Extra Slow-Flow nipple to try at next bottle feeding.    [name] [facility] 09/30/2018, 9:56 AM

## 2018-09-30 NOTE — Plan of Care (Signed)
  Problem: Education: Goal: Knowledge of General Education information will improve Description Including pain rating scale, medication(s)/side effects and non-pharmacologic comfort measures Note:  Patient declines need for interpreter. Patient's significant other interprets as needed and patient signed consent for significant other to interpret. Offered multiple times to provide/use interpreter; however, patient declined. Maxwell Caul, Leretha Dykes [place]

## 2018-09-30 NOTE — Progress Notes (Signed)
Subjective: Postpartum Day # 1 : S/P NSVD due to VBAC and SROM. Patient up ad lib, denies syncope or dizziness. Reports consuming regular diet without issues and denies N/V. Patient reports 0 bowel movement + passing flatus.  Denies issues with urination and reports bleeding is "light."  Patient is breast and bottle feeding and reports going well.  Desires undecided for postpartum contraception.  Pain is being appropriately managed with use of po meds.   1st degree clitoral hood laceration Feeding:  Breast/Bottle Contraceptive plan:  undecided BB: Circ out pt desired  Objective: Vital signs in last 24 hours: Patient Vitals for the past 24 hrs:  BP Temp Temp src Pulse Resp SpO2 Height Weight  09/30/18 0219 109/67 98.3 F (36.8 C) Oral (!) 51 16 - - -  09/29/18 2103 113/76 98.6 F (37 C) Oral 72 16 - - -  09/29/18 2004 112/72 98.8 F (37.1 C) Oral 62 16 - - -  09/29/18 1916 122/72 - - 70 16 - - -  09/29/18 1901 116/67 - - 81 16 - - -  09/29/18 1850 105/69 - - (!) 58 18 - - -  09/29/18 1831 104/64 - - 61 18 - - -  09/29/18 1815 103/63 - - 61 18 100 % - -  09/29/18 1800 (!) 100/56 - - 63 18 - - -  09/29/18 1750 (!) 114/58 98.9 F (37.2 C) Oral 76 16 - - -  09/29/18 1734 123/76 - - (!) 109 16 - - -  09/29/18 1731 123/76 - - (!) 109 16 - - -  09/29/18 1702 107/67 - - 64 16 - - -  09/29/18 1631 101/71 - - 63 16 - - -  09/29/18 1602 110/65 98.2 F (36.8 C) Oral (!) 58 16 - - -  09/29/18 1530 (!) 91/53 - - (!) 58 - - - -  09/29/18 1529 - - - - 16 - - -  09/29/18 1506 102/66 - - 61 18 - - -  09/29/18 1501 106/73 - - 67 16 - - -  09/29/18 1456 99/64 - - 61 16 - - -  09/29/18 1455 - - - - 16 - - -  09/29/18 1450 110/69 - - 64 18 - - -  09/29/18 1445 101/66 - - 65 18 - - -  09/29/18 1440 106/64 - - 61 14 - - -  09/29/18 1435 103/65 - - (!) 59 18 - - -  09/29/18 1431 97/60 - - 69 16 - - -  09/29/18 1428 95/61 - - 65 16 - - -  09/29/18 1424 101/64 98 F (36.7 C) Oral 78 18 - - -   09/29/18 1420 104/74 - - 76 16 - - -  09/29/18 1419 104/74 - - 76 - - - -  09/29/18 1401 119/75 - - (!) 59 18 - - -  09/29/18 1301 94/61 - - 63 18 - - -  09/29/18 1231 (!) 99/58 - - 63 - - - -  09/29/18 1230 - 97.9 F (36.6 C) Oral - 16 - - -  09/29/18 1201 (!) 97/54 - - 64 18 - - -  09/29/18 1139 (!) 99/51 - - (!) 107 16 - - -  09/29/18 1055 99/67 98.1 F (36.7 C) Oral 78 16 - - -  09/29/18 0930 109/80 - - 72 - 98 % - -  09/29/18 0903 - - - - - - 5\' 2"  (1.575 m) 75.3 kg  09/29/18 0902 107/81 98.4  F (36.9 C) Oral 99 16 97 % - -     Physical Exam:  General: alert, cooperative and appears stated age Mood/Affect: Happy Lungs: clear to auscultation, no wheezes, rales or rhonchi, symmetric air entry.  Heart: normal rate, regular rhythm, normal S1, S2, no murmurs, rubs, clicks or gallops. Breast: breasts appear normal, no suspicious masses, no skin or nipple changes or axillary nodes. Abdomen:  + bowel sounds, soft, non-tender GU: perineum approximate, healing well. No signs of external hematomas.  Uterine Fundus: firm Lochia: appropriate Skin: Warm, Dry. DVT Evaluation: No evidence of DVT seen on physical exam. Negative Homan's sign. No cords or calf tenderness. No significant calf/ankle edema.  CBC Latest Ref Rng & Units 09/30/2018 09/29/2018 06/16/2016  WBC 4.0 - 10.5 K/uL 7.6 4.6 8.0  Hemoglobin 12.0 - 15.0 g/dL 12.9 13.7 12.3  Hematocrit 36.0 - 46.0 % 38.7 40.7 36.7  Platelets 150 - 400 K/uL PENDING 136(L) 158    Results for orders placed or performed during the hospital encounter of 09/29/18 (from the past 24 hour(s))  SARS Coronavirus 2 (CEPHEID - Performed in [place] hospital lab), Hosp Order     Status: None   Collection Time: 09/29/18 10:18 AM  Result Value Ref Range   SARS Coronavirus 2 NEGATIVE NEGATIVE  Type and screen [place]     Status: None   Collection Time: 09/29/18 11:06 AM  Result Value Ref Range   ABO/RH(D) O POS    Antibody  Screen NEG    Sample Expiration      10/02/2018,2359 Performed at [facility], [place] [address] [place], [place] [address]   ABO/Rh     Status: None   Collection Time: 09/29/18 11:06 AM  Result Value Ref Range   ABO/RH(D)      O POS Performed at [place] [address], [place], [place] [address]   CBC     Status: Abnormal   Collection Time: 09/29/18 11:12 AM  Result Value Ref Range   WBC 4.6 4.0 - 10.5 K/uL   RBC 4.66 3.87 - 5.11 MIL/uL   Hemoglobin 13.7 12.0 - 15.0 g/dL   HCT 40.7 36.0 - 46.0 %   MCV 87.3 80.0 - 100.0 fL   MCH 29.4 26.0 - 34.0 pg   MCHC 33.7 30.0 - 36.0 g/dL   RDW 14.6 11.5 - 15.5 %   Platelets 136 (L) 150 - 400 K/uL   nRBC 0.0 0.0 - 0.2 %  RPR     Status: None   Collection Time: 09/29/18 11:12 AM  Result Value Ref Range   RPR Ser Ql Non Reactive Non Reactive  Group B strep by PCR     Status: None   Collection Time: 09/29/18 11:43 AM  Result Value Ref Range   Group B strep by PCR NEGATIVE NEGATIVE  Glucose, capillary     Status: Abnormal   Collection Time: 09/29/18 10:35 PM  Result Value Ref Range   Glucose-Capillary 156 (H) 70 - 99 mg/dL  CBC     Status: None (Preliminary result)   Collection Time: 09/30/18  5:11 AM  Result Value Ref Range   WBC 7.6 4.0 - 10.5 K/uL   RBC 4.37 3.87 - 5.11 MIL/uL   Hemoglobin 12.9 12.0 - 15.0 g/dL   HCT 38.7 36.0 - 46.0 %   MCV 88.6 80.0 - 100.0 fL   MCH 29.5 26.0 - 34.0 pg   MCHC 33.3 30.0 - 36.0 g/dL  RDW 14.4 11.5 - 15.5 %   Platelets PENDING 150 - 400 K/uL   nRBC 0.0 0.0 - 0.2 %     CBG (last 3)  Recent Labs    09/29/18 2235  GLUCAP 156*     I/O last 3 completed shifts: In: -  Out: 1200 [Urine:1050; Blood:150]   Assessment Postpartum Day # 1 : S/P NSVD due to VBAC and SROM. Pt stable. -1 involution. Breast and bottle feeding. Hemodynamically stable, HGB from 13.7-12.9 post delivery . DM2A was on glyburide during pregnancy , but unaware she had diabetes prior to pregnancy, pt was  dx @ NOB visit with hgA1c of 6.5.Last night 2 H PP was 156. CBG (last 3)  Recent Labs    09/29/18 2235  GLUCAP 156*   Plan: Continue other mgmt as ordered DM2: Diet and exercise. Will perform 2 H GTT @ 6 weeks PP visit. No medication.  VTE prophylactics: Early ambulated as tolerates.  Pain control: Motrin/Tylenol PRN Education given regarding options for contraception, including barrier methods, injectable contraception, IUD placement, oral contraceptives.  Plan for discharge tomorrow, Breastfeeding, Lactation consult and Contraception undecided for now, baby boy to be circ out pt.    Dr. Su Hiltoberts to be updated on patient status  [place] [facility] Center[facility] Tobey Lippard NP-C, CNM 09/30/2018, 6:22 AM

## 2018-09-30 NOTE — Anesthesia Postprocedure Evaluation (Signed)
Anesthesia Post Note  Patient: Mariah Joyce  Procedure(s) Performed: AN AD HOC LABOR EPIDURAL     Patient location during evaluation: Mother Baby Anesthesia Type: Epidural Level of consciousness: awake and alert and oriented Pain management: satisfactory to patient Vital Signs Assessment: post-procedure vital signs reviewed and stable Respiratory status: spontaneous breathing and nonlabored ventilation Cardiovascular status: stable Postop Assessment: no headache, no backache, no signs of nausea or vomiting, adequate PO intake, patient able to bend at knees, no apparent nausea or vomiting and able to ambulate (patient up walking) Anesthetic complications: no    Last Vitals:  Vitals:   09/30/18 0219 09/30/18 0629  BP: 109/67 99/68  Pulse: (!) 51 (!) 57  Resp: 16 16  Temp: 36.8 C 36.6 C  SpO2:      Last Pain:  Vitals:   09/30/18 0629  TempSrc: Oral  PainSc: 0-No pain   Pain Goal:                   Derick Seminara

## 2018-09-30 NOTE — Lactation Note (Signed)
This note was copied from a baby's chart. Lactation Consultation Note  Patient Name: Mariah Joyce [id number] Date: 09/30/2018    After speaking with nurse, I called SLP to request a feeding consult. Dr. Nigel Bridgeman was made aware & agreeable.    [name] [facility] 09/30/2018, 4:00 PM

## 2018-10-01 DIAGNOSIS — O42913 Preterm premature rupture of membranes, unspecified as to length of time between rupture and onset of labor, third trimester: Secondary | ICD-10-CM

## 2018-10-01 HISTORY — DX: Preterm premature rupture of membranes, unspecified as to length of time between rupture and onset of labor, third trimester: O42.913

## 2018-10-01 LAB — GLUCOSE, CAPILLARY
Glucose-Capillary: 76 mg/dL (ref 70–99)
Glucose-Capillary: 105 mg/dL — ABNORMAL HIGH (ref 70–99)

## 2018-10-01 MED ORDER — IBUPROFEN 600 MG PO TABS: 600.0000 mg | ORAL_TABLET | Freq: Four times a day (QID) | ORAL | 0 refills | Status: DC

## 2018-10-01 NOTE — Lactation Note (Signed)
This note was copied from a baby's chart. Lactation Consultation Note  Patient Name: Mariah Joyce [id number] Date: 10/01/2018 Reason for consult: Follow-up assessment;Late-preterm 34-36.6wks;Infant < 6lbs  P4 mother whose infant is now 47 hours old.  This is a LPTI at 36+1 weeks weighing < 6 lbs.  Offered to use interpreter, but family declined.  Father was  the interpreter.  Mother had baby latched when I arrived, however, it was obvious that he was not latched and feeding effectively.  Offered to assist and mother accepted.  Positioned mother appropriately with good pillow support.  Mother had been using the cradle hold.  Suggested the cross cradle hold and she was willing to attempt this position.  Baby was awake but not showing feeding cues.  Father informed me that he formula feed "about an hour ago."  Demonstrated techniques used to awaken baby and latched to the right breast without difficulty.  However, once at the breast, baby had to be gently stimulated to begin sucking.  Showed mother how to obtain and maintain a deep latch and how to do breast compressions.  She required direction on proper hand/finger placement and how to gently arouse him during feeding to keep him awake.  Father also helped remind mother and will be a good support for her at home.  Observed baby feeding on/off for 10 minutes before he self released.  Demonstrated swaddling and placed baby in mother's arms.  Baby has been taking adequate formula volumes per the LPTI policy and, at times, has exceeded his minimum volume.  Parents do not have difficulty bottle feeding baby.    Mother will be a "stay at home" mother and does not have a DEBP for home use.  [facility] referral faxed and explained to father how to obtain this pump after discharge.  Manual pump with instructions for use provided.  Pump parts and assembly discussed.  Mother was not interested in checking the flange fit at this time.  Parents have our OP phone  number for questions/concerns after discharge.  RN updated.     Maternal Data Formula Feeding for Exclusion: Yes Reason for exclusion: Mother's choice to formula and breast feed on admission Has patient been taught Hand Expression?: Yes  Feeding Feeding Type: Breast Fed Nipple Type: Dr. Clement Husbands  LATCH Score Latch: Grasps breast easily, tongue down, lips flanged, rhythmical sucking.  Audible Swallowing: None  Type of Nipple: Everted at rest and after stimulation(short shafted)  Comfort (Breast/Nipple): Soft / non-tender  Hold (Positioning): Assistance needed to correctly position infant at breast and maintain latch.  LATCH Score: 7  Interventions Interventions: Breast feeding basics reviewed;Assisted with latch;Skin to skin;Breast compression;Support pillows;Adjust position;DEBP  Lactation Tools Discussed/Used WIC Program: Yes   Consult Status Consult Status: Complete    Akiba Melfi R Faris Coolman 10/01/2018, 10:42 AM

## 2018-10-01 NOTE — Discharge Summary (Signed)
SVD OB Discharge Summary     Patient Name: Mariah Joyce DOB: 11/19/1980 MRN: [id number]  Date of admission: 09/29/2018 Delivering MD: Mariah Joyce  Date of delivery: 09/29/2018 Type of delivery: SVD  Newborn Data: Sex: Baby female  Circumcision: out pt desired Live born female  Birth Weight: 5 lb 14 oz (2665 g) APGAR: 8, 9  Newborn Delivery   Birth date/time:  09/29/2018 17:34:00 Delivery type:  VBAC, Spontaneous     Feeding: breast and bottle Infant being discharge to home with mother in stable condition.   Admitting diagnosis: Preterm premature rupture of membranes in third trimester, unspecified duration to onset of labor [O42.913] Intrauterine pregnancy: 37103w1d     Secondary diagnosis:  Active Problems:   Delayed delivery after SROM (spontaneous rupture of membranes)   VBAC (vaginal birth after Cesarean)   Preterm premature rupture of membranes in third trimester   Preterm delivery   Normal postpartum course                                Complications: None                                                              Intrapartum Procedures: spontaneous vaginal delivery Postpartum Procedures: none Complications-Operative and Postpartum: 1st degree perineal laceration Augmentation: Pitocin   History of Present Illness: [name] is a 38 y.o. female, [id number], who presents at 5103w1d weeks gestation. The [facility]  [facility]  Her pregnancy has been complicated by: Mariah Joyce is a 38 y.o. female, G5P3013 at 36.1 weeks, presenting for SROM clear fluid a 0300t.  Previous hx of vbac in 2015.  Has had a female circumcision.  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  Patient Active Problem List   Diagnosis Date Noted  . Preterm premature rupture of membranes in third trimester 10/01/2018  . Preterm delivery 10/01/2018  . Normal postpartum course 10/01/2018   . Delayed delivery after SROM (spontaneous rupture of membranes) 09/29/2018  . VBAC (vaginal birth after Cesarean) 09/29/2018  . Visit for routine gyn exam 09/26/2017  . Desire for pregnancy 09/26/2017  . Exposure to sexually transmitted disease (STD) 09/26/2017  . 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 H/O vaginal delivery prior to C/S 03/11/2014  . Language barrier 03/10/2014  . Obesity 03/10/2014  . History of gestational diabetes mellitus (GDM) 03/10/2014    Hospital course:  Onset of Labor With Vaginal Delivery     38 y.o. yo [id number] at 51103w1d was admitted in Latent Labor on 09/29/2018. Patient had an uncomplicated labor course as follows:  Membrane Rupture Time/Date: 3:00 AM ,09/29/2018   Intrapartum Procedures: Episiotomy: Right Mediolateral [4]                                         Lacerations:     Patient had a delivery of a Viable infant. 09/29/2018  Information for the patient's newborn:  Mariah Joyce, Mariah Joyce [[id number]][[id number]]  Delivery Method: VBAC, Spontaneous(Filed from Delivery Summary)    Pateint had an uncomplicated  postpartum course.  She is ambulating, tolerating a regular diet, passing flatus, and urinating well. Patient is discharged home in stable condition on 10/01/18.  Postpartum Day # 2 : S/P NSVD due to spontaneous labor with SROM. Patient up ad lib, denies syncope or dizziness. Reports consuming regular diet without issues and denies N/V. Patient reports 0 bowel movement + passing flatus.  Denies issues with urination and reports bleeding is "light."  Patient is breast and bottlefeeding and reports going well.  Desires IUD for postpartum contraception.  Pain is being appropriately managed with use of po meds.   Physical exam  Vitals:   09/30/18 0629 09/30/18 0930 09/30/18 1447 09/30/18 2129  BP: 99/68 107/74 110/74 94/62  Pulse: (!) 57  75 67  Resp: 16 16 18 17   Temp: 97.9 F (36.6 C) 98 F (36.7 C) 98.3 F (36.8 C) 98.6 F (37 C)  TempSrc: Oral Oral Oral Oral   SpO2:  100% 99%   Weight:      Height:       General: alert, cooperative and no distress Lochia: appropriate Uterine Fundus: firm Perineum: Approximate, no hematomas noted DVT Evaluation: No evidence of DVT seen on physical exam. Negative Homan's sign. No cords or calf tenderness. No significant calf/ankle edema.  Labs: Lab Results  Component Value Date   WBC 7.6 09/30/2018   HGB 12.9 09/30/2018   HCT 38.7 09/30/2018   MCV 88.6 09/30/2018   PLT 115 (L) 09/30/2018   CMP Latest Ref Rng & Units 06/16/2016  Glucose 65 - 99 mg/dL 161(W140(H)  BUN 6 - 20 mg/dL 5(L)  Creatinine 9.600.44 - 1.00 mg/dL 4.540.63  Sodium 098135 - 119145 mmol/L 139  Potassium 3.5 - 5.1 mmol/L 3.5  Chloride 101 - 111 mmol/L 109  CO2 22 - 32 mmol/L 22  Calcium 8.9 - 10.3 mg/dL 1.4(N8.4(L)  Total Protein 6.5 - 8.1 g/dL 6.2(L)  Total Bilirubin 0.3 - 1.2 mg/dL 0.7  Alkaline Phos 38 - 126 U/L 51  AST 15 - 41 U/L 16  ALT 14 - 54 U/L 12(L)    Date of discharge: 10/01/2018 Discharge Diagnoses: Premature labor and with preterm delivery. VBAC. Discharge instruction: per After Visit Summary and "Baby and Me Booklet".  After visit meds:    Activity:           unrestricted and pelvic rest Advance as tolerated. Pelvic rest for 6 weeks.  Diet:                routine Medications: PNV and Ibuprofen Postpartum contraception: IUD Mirena Condition:  Pt discharge to home with baby in stable  Baby Female out pt circ.   Meds: Allergies as of 10/01/2018   No Known Allergies     Medication List    TAKE these medications   ibuprofen 600 MG tablet Commonly known as:  ADVIL Take 1 tablet (600 mg total) by mouth every 6 (six) hours.   ONE-A-DAY WOMENS PRENATAL PO Take by mouth.   terconazole 0.4 % vaginal cream Commonly known as:  Terazol 7 Place 1 applicator vaginally at bedtime.       Discharge Follow Up:  Follow-up Information    [facility] Follow up in 6 week(s).   Specialty:  Obstetrics and  Gynecology Why:  6weeks PPV and please call the office tyo make a circ appointment within 1-2 weeks.  Contact information: [address] [place] [phone]  [place], NP-C, CNM 10/01/2018, 2:26 AM  Noralyn Pick, FNP

## 2019-10-03 ENCOUNTER — Encounter (HOSPITAL_COMMUNITY): Payer: Self-pay

## 2019-10-03 ENCOUNTER — Ambulatory Visit (HOSPITAL_COMMUNITY)
Admission: EM | Admit: 2019-10-03 | Discharge: 2019-10-03 | Disposition: A | Discharge status: Home / Self Care | Payer: Medicaid Other | Attending: Family Medicine | Admitting: Family Medicine

## 2019-10-03 ENCOUNTER — Other Ambulatory Visit: Payer: Self-pay

## 2019-10-03 DIAGNOSIS — L292 Pruritus vulvae: Secondary | ICD-10-CM

## 2019-10-03 DIAGNOSIS — B3731 Acute candidiasis of vulva and vagina: Secondary | ICD-10-CM

## 2019-10-03 DIAGNOSIS — L501 Idiopathic urticaria: Secondary | ICD-10-CM

## 2019-10-03 DIAGNOSIS — L309 Dermatitis, unspecified: Secondary | ICD-10-CM | POA: Diagnosis not present

## 2019-10-03 LAB — POCT URINALYSIS DIP (DEVICE)
Bilirubin Urine: NEGATIVE
Glucose, UA: 100 mg/dL — AB
Ketones, ur: NEGATIVE mg/dL
Nitrite: NEGATIVE
Protein, ur: NEGATIVE mg/dL
Specific Gravity, Urine: 1.01 (ref 1.005–1.030)
Urobilinogen, UA: 0.2 mg/dL (ref 0.0–1.0)
pH: 5 (ref 5.0–8.0)

## 2019-10-03 MED ORDER — CETIRIZINE HCL 10 MG PO TABS: 10.0000 mg | ORAL_TABLET | Freq: Two times a day (BID) | ORAL | 1 refills | Status: DC

## 2019-10-03 MED ORDER — TRIAMCINOLONE ACETONIDE 0.1 % EX OINT: 1.0000 "application " | TOPICAL_OINTMENT | Freq: Two times a day (BID) | CUTANEOUS | 1 refills | Status: DC

## 2019-10-03 MED ORDER — FLUCONAZOLE 150 MG PO TABS: 150.0000 mg | ORAL_TABLET | Freq: Every day | ORAL | 0 refills | Status: DC

## 2019-10-03 NOTE — Discharge Instructions (Addendum)
Take the fluconazole now and repeat in one week.  This is for the yeast infection Take the cetirizine 2 x a day for the itching and rash that comes and goes Use the triamcinolone ointment for the rash on the arms See a primary care doctor in follow up

## 2019-10-03 NOTE — ED Provider Notes (Signed)
[facility]    CSN: [id number] Arrival date & time: 10/03/19  1129      History   Chief Complaint Chief Complaint  Patient presents with  . Rash    HPI Mariah Joyce is a 39 y.o. female.   HPI  Patient is here with an adolescent daughter who interprets for her.  She was offered an interpreter service and they declined. She has 3 basic complaints. She has a rash on the flexor surface of her right and left forearms that itch.  It appears eczematous.  She has no medication for this. She has a rash that appears occasionally around her neck.  Also on her trunk.  She shows me pictures of this and clearly it is urticaria. She also has vaginal itching and tells me she has a vaginal infection.  She does not want testing for vaginitis or for contagious illness (STD).   Past Medical History:  Diagnosis Date  . Complication of anesthesia   . GDM (gestational diabetes mellitus)   . Medical history non-contributory     Patient Active Problem List   Diagnosis Date Noted  . Preterm premature rupture of membranes in third trimester 10/01/2018  . Preterm delivery 10/01/2018  . Normal postpartum course 10/01/2018  . Delayed delivery after SROM (spontaneous rupture of membranes) 09/29/2018  . VBAC (vaginal birth after Cesarean) 09/29/2018  . Visit for routine gyn exam 09/26/2017  . Desire for pregnancy 09/26/2017  . Exposure to sexually transmitted disease (STD) 09/26/2017  . 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 H/O vaginal delivery prior to C/S 03/11/2014  . Language barrier 03/10/2014  . Obesity 03/10/2014  . History of gestational diabetes mellitus (GDM) 03/10/2014    Past Surgical History:  Procedure Laterality Date  . CESAREAN SECTION     for non-reassuring fhr    OB History    Gravida  5   Para  4   Term  3   Preterm  1   AB  1   Living  4     SAB  1   TAB  0   Ectopic  0   Multiple  0   Live Births  4            Home  Medications    Prior to Admission medications   Medication Sig Start Date End Date Taking? Authorizing Provider  cetirizine (ZYRTEC) 10 MG tablet Take 1 tablet (10 mg total) by mouth 2 (two) times daily. 10/03/19   Eustace Moore, MD  fluconazole (DIFLUCAN) 150 MG tablet Take 1 tablet (150 mg total) by mouth daily. Repeat in 1 week if needed 10/03/19   Eustace Moore, MD  triamcinolone ointment (KENALOG) 0.1 % Apply 1 application topically 2 (two) times daily. 10/03/19   Eustace Moore, MD    Family History No family history on file.  Social History Social History   Tobacco Use  . Smoking status: Never Smoker  . Smokeless tobacco: Never Used  Substance Use Topics  . Alcohol use: No  . Drug use: No     Allergies   Patient has no known allergies.   Review of Systems Review of Systems  Genitourinary: Positive for vaginal discharge.  Skin: Positive for rash.     Physical Exam Triage Vital Signs ED Triage Vitals  Enc Vitals Group     BP 10/03/19 1300 115/80     Pulse Rate 10/03/19 1300 73     Resp 10/03/19 1300 18  Temp 10/03/19 1300 98.1 F (36.7 C)     Temp Source 10/03/19 1300 Oral     SpO2 10/03/19 1300 98 %     Weight 10/03/19 1301 164 lb (74.4 kg)     Height 10/03/19 1301 5\' 3"  (1.6 m)     Head Circumference --      Peak Flow --      Pain Score 10/03/19 1301 0     Pain Loc --      Pain Edu? --      Excl. in [place]? --    No data found.  Updated Vital Signs BP 115/80   Pulse 73   Temp 98.1 F (36.7 C) (Oral)   Resp 18   Ht 5\' 3"  (1.6 m)   Wt 74.4 kg   SpO2 98%   BMI 29.05 kg/m       Physical Exam Constitutional:      General: She is not in acute distress.    Appearance: She is well-developed and normal weight.  HENT:     Head: Normocephalic and atraumatic.     Mouth/Throat:     Comments: Wears Arabic head cover and mask Eyes:     Conjunctiva/sclera: Conjunctivae normal.     Pupils: Pupils are equal, round, and reactive to light.    Cardiovascular:     Rate and Rhythm: Normal rate.  Pulmonary:     Effort: Pulmonary effort is normal. No respiratory distress.  Musculoskeletal:        General: Normal range of motion.     Cervical back: Normal range of motion.  Skin:    General: Skin is warm and dry.     Comments: The forearms are examined.  She has vesicular raised rash in both antecubital fossa with some red skin and some upper pigmentation consistent with eczema  Neurological:     Mental Status: She is alert.  Psychiatric:        Mood and Affect: Mood normal.        Behavior: Behavior normal.      UC Treatments / Results  Labs (all labs ordered are listed, but only abnormal results are displayed) Labs Reviewed  POCT URINALYSIS DIP (DEVICE) - Abnormal; Notable for the following components:      Result Value   Glucose, UA 100 (*)    Hgb urine dipstick SMALL (*)    Leukocytes,Ua SMALL (*)    All other components within normal limits    EKG   Radiology No results found.  Procedures Procedures (including critical care time)  Medications Ordered in UC Medications - No data to display  Initial Impression / Assessment and Plan / UC Course  I have reviewed the triage vital signs and the nursing notes.  Pertinent labs & imaging results that were available during my care of the patient were reviewed by me and considered in my medical decision making (see chart for details).  Clinical Course as of Oct 03 2115  Fri Oct 03, 2019  1404 POCT Urinalysis, Dipstick [YN]    Clinical Course User Index [YN] Raylene Everts, MD     Final Clinical Impressions(s) / UC Diagnoses   Final diagnoses:  Eczema, unspecified type  Chronic idiopathic urticaria  Vulvovaginitis due to yeast     Discharge Instructions     Take the fluconazole now and repeat in one week.  This is for the yeast infection Take the cetirizine 2 x a day for the itching and rash that comes  and goes Use the triamcinolone ointment  for the rash on the arms See a primary care doctor in follow up   ED Prescriptions    Medication Sig Dispense Auth. Provider   triamcinolone ointment (KENALOG) 0.1 % Apply 1 application topically 2 (two) times daily. 30 g Eustace Moore, MD   fluconazole (DIFLUCAN) 150 MG tablet Take 1 tablet (150 mg total) by mouth daily. Repeat in 1 week if needed 2 tablet Eustace Moore, MD   cetirizine (ZYRTEC) 10 MG tablet Take 1 tablet (10 mg total) by mouth 2 (two) times daily. 60 tablet Eustace Moore, MD     PDMP not reviewed this encounter.   Eustace Moore, MD 10/03/19 2119

## 2019-10-03 NOTE — ED Triage Notes (Addendum)
Pt c/o rash on arms bilatx2wks. Pt states her whole body has been itching for 6 mos, but has only had rash on arms for 2 wks now. PT states the itchiness is worse at night. Pt has red raised bumps on arms bilat. Pt c/o vaginal itchiness. Pt c/o dysuria.

## 2019-10-03 NOTE — ED Notes (Signed)
I used ipad interpreter to triage pt 

## 2020-04-21 ENCOUNTER — Ambulatory Visit (HOSPITAL_COMMUNITY)
Admission: EM | Admit: 2020-04-21 | Discharge: 2020-04-21 | Disposition: A | Discharge status: Home / Self Care | Payer: BC Managed Care – PPO | Attending: Urgent Care | Admitting: Urgent Care

## 2020-04-21 DIAGNOSIS — L299 Pruritus, unspecified: Secondary | ICD-10-CM

## 2020-04-21 DIAGNOSIS — L509 Urticaria, unspecified: Secondary | ICD-10-CM

## 2020-04-21 DIAGNOSIS — R21 Rash and other nonspecific skin eruption: Secondary | ICD-10-CM | POA: Diagnosis not present

## 2020-04-21 MED ORDER — PREDNISONE 20 MG PO TABS: ORAL_TABLET | ORAL | 0 refills | Status: DC

## 2020-04-21 MED ORDER — CETIRIZINE HCL 10 MG PO TABS: 10.0000 mg | ORAL_TABLET | Freq: Every day | ORAL | 1 refills | Status: DC

## 2020-04-21 MED ORDER — HYDROXYZINE HCL 25 MG PO TABS: 12.5000 mg | ORAL_TABLET | Freq: Three times a day (TID) | ORAL | 0 refills | Status: DC | PRN

## 2020-04-21 NOTE — Discharge Instructions (Signed)
You can use hydroxyzine 1-3 times per day 1/2-1 full tablet for itching and hives. If this does not help then switch back to Zyrtec.

## 2020-04-21 NOTE — ED Provider Notes (Signed)
[facility]   MRN: [id number] DOB: 1981-03-22  Subjective:   Mariah Joyce is a 39 y.o. female presenting for 1 year history of persistent intermittent daily urticarial lesions over her torso, arms and legs.  She has consistently used Zyrtec and triamcinolone creams.  They helped her short-term have not resolved her issues.  She has not seen an allergist.  Denies oral swelling, chest tightness.  Has kept her routine the same but has not paid attention to any particular allergens that may elicit her symptoms including any foods, cleaning agents, hygiene products.  No current facility-administered medications for this encounter.  Current Outpatient Medications:  .  cetirizine (ZYRTEC) 10 MG tablet, Take 1 tablet (10 mg total) by mouth 2 (two) times daily., Disp: 60 tablet, Rfl: 1 .  fluconazole (DIFLUCAN) 150 MG tablet, Take 1 tablet (150 mg total) by mouth daily. Repeat in 1 week if needed, Disp: 2 tablet, Rfl: 0 .  triamcinolone ointment (KENALOG) 0.1 %, Apply 1 application topically 2 (two) times daily., Disp: 30 g, Rfl: 1   No Known Allergies  Past Medical History:  Diagnosis Date  . Complication of anesthesia   . GDM (gestational diabetes mellitus)   . Medical history non-contributory      Past Surgical History:  Procedure Laterality Date  . CESAREAN SECTION     for non-reassuring fhr    No family history on file.  Social History   Tobacco Use  . Smoking status: Never Smoker  . Smokeless tobacco: Never Used  Substance Use Topics  . Alcohol use: No  . Drug use: No    ROS   Objective:   Vitals: BP 114/66 (BP Location: Right Arm)   Pulse 62   Temp 98.3 F (36.8 C) (Oral)   Resp 16   SpO2 100%   Physical Exam Constitutional:      General: She is not in acute distress.    Appearance: Normal appearance. She is well-developed. She is not ill-appearing, toxic-appearing or diaphoretic.  HENT:     Head: Normocephalic and  atraumatic.     Nose: Nose normal.     Mouth/Throat:     Mouth: Mucous membranes are moist.     Pharynx: Oropharynx is clear. No oropharyngeal exudate or posterior oropharyngeal erythema.  Eyes:     General: No scleral icterus.    Extraocular Movements: Extraocular movements intact.     Pupils: Pupils are equal, round, and reactive to light.  Cardiovascular:     Rate and Rhythm: Normal rate.  Pulmonary:     Effort: Pulmonary effort is normal.  Skin:    General: Skin is warm and dry.     Findings: Rash (patches of urticaria over right lower leg, left forearm with associated excoriations) present.  Neurological:     General: No focal deficit present.     Mental Status: She is alert and oriented to person, place, and time.     Motor: No weakness.     Coordination: Coordination normal.     Gait: Gait normal.     Deep Tendon Reflexes: Reflexes normal.  Psychiatric:        Mood and Affect: Mood normal.        Behavior: Behavior normal.        Thought Content: Thought content normal.        Judgment: Judgment normal.      Assessment and Plan :   PDMP not reviewed this encounter.  1. Rash  and nonspecific skin eruption   2. Urticaria   3. Itching     Recommended keeping a food diary, switching to cleaning and hygiene products for gentle skin. Start prednisone course just for 5 days, okay to use hydroxyzine together with this.  If hydroxyzine does not help, you Zyrtec long-term.  Referral to allergy center pending. Counseled patient on potential for adverse effects with medications prescribed/recommended today, ER and return-to-clinic precautions discussed, patient verbalized understanding.    Wallis Bamberg, PA-C 04/21/20 1452

## 2020-04-21 NOTE — ED Triage Notes (Signed)
PT C/O: rash all over body onset 1 year... Sts rash seems to appear only at night???   DENIES: fevers  TAKING MEDS: Cetirizine 10 mg and Triamcinolone Ointment but has run out and needing refills.   A&O x4... NAD... Ambulatory

## 2020-07-28 ENCOUNTER — Ambulatory Visit (INDEPENDENT_AMBULATORY_CARE_PROVIDER_SITE_OTHER): Payer: BC Managed Care – PPO | Admitting: Allergy

## 2020-07-28 ENCOUNTER — Encounter: Payer: Self-pay | Admitting: Allergy

## 2020-07-28 ENCOUNTER — Other Ambulatory Visit: Payer: Self-pay

## 2020-07-28 VITALS — BP 110/80 | HR 77 | Temp 98.4°F | Resp 14 | Ht 62.21 in | Wt 167.4 lb

## 2020-07-28 DIAGNOSIS — L508 Other urticaria: Secondary | ICD-10-CM | POA: Diagnosis not present

## 2020-07-28 MED ORDER — CETIRIZINE HCL 10 MG PO TABS: 10.0000 mg | ORAL_TABLET | Freq: Two times a day (BID) | ORAL | 5 refills | Status: DC

## 2020-07-28 MED ORDER — FAMOTIDINE 20 MG PO TABS: 20.0000 mg | ORAL_TABLET | Freq: Two times a day (BID) | ORAL | 5 refills | Status: DC

## 2020-07-28 NOTE — Patient Instructions (Addendum)
Chronic hives - at this time etiology of hives and swelling is unknown.  Hives can be caused by a variety of different triggers including illness/infection, foods, medications, stings, exercise, pressure, vibrations, extremes of temperature to name a few however majority of the time there is no identifiable trigger.  Your symptoms have been ongoing for >6 weeks making this chronic thus will obtain labwork to evaluate: CBC w diff, CMP, tryptase, hive panel, environmental panel, alpha-gal panel, inflammatory markers, nut IgE panel - for management of hives recommend the following high-dose antihistamine regimen: Zyrtec 10mg  1 tab twice a day with Pepcid 20mg  1 tab twice day  - if the above 2 medication regimen if not enough in controlling hives then recommend adding Singulair - if triple therapy regimen is not effective enough in controlling hives then would recommend starting Xolair montly injections.  We will discuss this in more detail in needed.   - should significant symptoms recur or new symptoms occur, a journal is to be kept recording any foods eaten, beverages consumed, medications taken, activities performed, and environmental conditions within a 6 hour time period prior to the onset of symptoms.  Follow-up in 2-3 months or sooner if needed

## 2020-07-28 NOTE — Progress Notes (Signed)
New Patient Note  RE: Mariah Joyce MRN: [id number] DOB: 02-21-1981 Date of Office Visit: 07/28/2020  Referring provider: Wallis Bamberg, PA-C Primary care provider: none  Chief Complaint: hives  History of present illness: Mariah Joyce is a 40 y.o. female presenting today for consultation for urticaria. Arabic interpreter present for translation.  She does not have a PCP at this time and reports going to urgent care for medical issues.  She has been having hives for past 2 years that occurs about every 2-3 days and occurs anywhere on the body.   The hives are itchy.  They last less than 24 hours at a time.  Once resolved does not leave any bruising marks behind.  She has noted some joint pain but is not sure if associated with the rash.   She states the rash started pretty quickly after delivering her son. She states taking shower calms down the rash.  Using ice to area helps.  She does report pressure areas like bra and underwear line can have more hive develop.   She states when she eats nuts within 5 minutes can note hives after.  She has been avoiding nuts due to this.   Denies preceding illness.  No new medications.  No other  She has been taking cetirizine daily as needed and it does help with the itch.  She was seen in urgent care on 04/21/2020 for the hives and was recommended to take cetirizine 1 tablet daily, hydroxyzine up to 25 mg every 8 hours as needed as well as prescribed prednisone course.  She states she felt sick and like her body was heavy on the prednisone and she only took for 2 days.   She states she take the hydroxyzine but only for like 5 days or so and ran out of the prescription.    She has no history of asthma, eczema or seasonal/perennial allergic rhinoconjunctivitis.   Review of systems: Review of Systems  Constitutional: Negative.   HENT: Negative.   Eyes: Negative.   Respiratory: Negative.   Cardiovascular: Negative.    Gastrointestinal: Negative.   Musculoskeletal: Negative.   Skin: Positive for itching and rash.  Neurological: Negative.     All other systems negative unless noted above in HPI  Past medical history: Past Medical History:  Diagnosis Date  . Complication of anesthesia   . GDM (gestational diabetes mellitus)     Past surgical history: Past Surgical History:  Procedure Laterality Date  . CESAREAN SECTION     for non-reassuring fhr    Family history:  Family History  Problem Relation Age of Onset  . Asthma Neg Hx   . Eczema Neg Hx   . Environmental Allergies Neg Hx     Social history: Lives in an apartment with carpeting with electric heating and central cooling.  No pets in the home.  There is no concern for water damage, mildew or roaches in the home.  She does not list an occupation.  She denies a smoking history.  Medication List: Current Outpatient Medications  Medication Sig Dispense Refill  . cetirizine (ZYRTEC) 10 MG tablet Take 1 tablet (10 mg total) by mouth daily. 90 tablet 1  . fluticasone (FLONASE) 50 MCG/ACT nasal spray fluticasone propionate 50 mcg/actuation nasal spray,suspension  SHAKE LQ AND U 2 SPRAYS IEN D    . hydrOXYzine (ATARAX/VISTARIL) 25 MG tablet Take 0.5-1 tablets (12.5-25 mg total) by mouth every 8 (eight) hours as needed for itching. 30 tablet  0  . predniSONE (DELTASONE) 20 MG tablet Take 2 tablets daily with breakfast. 10 tablet 0  . triamcinolone ointment (KENALOG) 0.1 % Apply 1 application topically 2 (two) times daily. 30 g 1  . fluconazole (DIFLUCAN) 150 MG tablet Take 1 tablet (150 mg total) by mouth daily. Repeat in 1 week if needed (Patient not taking: Reported on 07/28/2020) 2 tablet 0   No current facility-administered medications for this visit.    Known medication allergies: No Known Allergies   Physical examination: Blood pressure 110/80, pulse 77, temperature 98.4 F (36.9 C), resp. rate 14, height 5' 2.21" (1.58 m), weight  167 lb 6.4 oz (75.9 kg), SpO2 99 %, unknown if currently breastfeeding.  General: Alert, interactive, in no acute distress. HEENT: PERRLA, TMs pearly gray, turbinates non-edematous without discharge, post-pharynx non erythematous. Neck: Supple without lymphadenopathy. Lungs: Clear to auscultation without wheezing, rhonchi or rales. {no increased work of breathing. CV: Normal S1, S2 without murmurs. Abdomen: Nondistended, nontender. Skin: Scattered erythematous urticarial type lesions primarily located Bilateral tops of her feet , nonvesicular. Extremities:  No clubbing, cyanosis or edema. Neuro:   Grossly intact.  Diagnositics/Labs:  Allergy testing: deferred due to ongoing urticaria  Assessment and plan:   Chronic urticaria - at this time etiology of hives and swelling is unknown.  Hives can be caused by a variety of different triggers including illness/infection, foods, medications, stings, exercise, pressure, vibrations, extremes of temperature to name a few however majority of the time there is no identifiable trigger.  Your symptoms have been ongoing for >6 weeks making this chronic thus will obtain labwork to evaluate: CBC w diff, CMP, tryptase, hive panel, environmental panel, alpha-gal panel, inflammatory markers, nut IgE panel - for management of hives recommend the following high-dose antihistamine regimen: Zyrtec 10mg  1 tab twice a day with Pepcid 20mg  1 tab twice day  - if the above 2 medication regimen if not enough in controlling hives then recommend adding Singulair - if triple therapy regimen is not effective enough in controlling hives then would recommend starting Xolair montly injections.  We will discuss this in more detail in needed.   - should significant symptoms recur or new symptoms occur, a journal is to be kept recording any foods eaten, beverages consumed, medications taken, activities performed, and environmental conditions within a 6 hour time period prior to the  onset of symptoms.  Follow-up in 2-3 months or sooner if needed  I appreciate the opportunity to take part in [name]'s care. Please do not hesitate to contact me with questions.  Sincerely,   , MD Allergy/Immunology [facility]

## 2020-08-10 LAB — COMPREHENSIVE METABOLIC PANEL
ALT: 13 IU/L (ref 0–32)
AST: 13 IU/L (ref 0–40)
Albumin/Globulin Ratio: 1.6 (ref 1.2–2.2)
Albumin: 4.3 g/dL (ref 3.8–4.8)
Alkaline Phosphatase: 73 IU/L (ref 44–121)
BUN/Creatinine Ratio: 25 — ABNORMAL HIGH (ref 9–23)
BUN: 15 mg/dL (ref 6–20)
Bilirubin Total: <0.2 mg/dL (ref 0.0–1.2)
CO2: 19 mmol/L — ABNORMAL LOW (ref 20–29)
Calcium: 9.1 mg/dL (ref 8.7–10.2)
Chloride: 104 mmol/L (ref 96–106)
Creatinine, Ser: 0.61 mg/dL (ref 0.57–1.00)
Globulin, Total: 2.7 g/dL (ref 1.5–4.5)
Glucose: 149 mg/dL — ABNORMAL HIGH (ref 65–99)
Potassium: 4.3 mmol/L (ref 3.5–5.2)
Sodium: 138 mmol/L (ref 134–144)
Total Protein: 7 g/dL (ref 6.0–8.5)
eGFR: 117 mL/min/{1.73_m2} (ref >59)

## 2020-08-10 LAB — ALLERGENS W/TOTAL IGE AREA 2
Alternaria Alternata IgE: 0.15 kU/L — AB
Aspergillus Fumigatus IgE: <0.1 kU/L
Bermuda Grass IgE: 0.35 kU/L — AB
Cat Dander IgE: <0.1 kU/L
Cedar, Mountain IgE: 0.2 kU/L — AB
Cladosporium Herbarum IgE: <0.1 kU/L
Cockroach, German IgE: 1.16 kU/L — AB
Common Silver Birch IgE: 0.22 kU/L — AB
Cottonwood IgE: 0.28 kU/L — AB
D Farinae IgE: 86.1 kU/L — AB
D Pteronyssinus IgE: >100 kU/L — AB
Dog Dander IgE: <0.1 kU/L
Elm, American IgE: 0.27 kU/L — AB
Johnson Grass IgE: 0.14 kU/L — AB
Maple/Box Elder IgE: 0.14 kU/L — AB
Mouse Urine IgE: <0.1 kU/L
Oak, White IgE: 0.31 kU/L — AB
Pecan, Hickory IgE: 0.3 kU/L — AB
Penicillium Chrysogen IgE: <0.1 kU/L
Pigweed, Rough IgE: 0.29 kU/L — AB
Ragweed, Short IgE: 0.48 kU/L — AB
Sheep Sorrel IgE Qn: 0.3 kU/L — AB
Timothy Grass IgE: 0.18 kU/L — AB
White Mulberry IgE: <0.1 kU/L

## 2020-08-10 LAB — CBC WITH DIFFERENTIAL
Basophils Absolute: 0 10*3/uL (ref 0.0–0.2)
Basos: 1 %
EOS (ABSOLUTE): 0.1 10*3/uL (ref 0.0–0.4)
Eos: 2 %
Hematocrit: 42.1 % (ref 34.0–46.6)
Hemoglobin: 13.5 g/dL (ref 11.1–15.9)
Immature Grans (Abs): 0 10*3/uL (ref 0.0–0.1)
Immature Granulocytes: 0 %
Lymphocytes Absolute: 1.3 10*3/uL (ref 0.7–3.1)
Lymphs: 37 %
MCH: 28.3 pg (ref 26.6–33.0)
MCHC: 32.1 g/dL (ref 31.5–35.7)
MCV: 88 fL (ref 79–97)
Monocytes Absolute: 0.3 10*3/uL (ref 0.1–0.9)
Monocytes: 10 %
Neutrophils Absolute: 1.7 10*3/uL (ref 1.4–7.0)
Neutrophils: 50 %
RBC: 4.77 x10E6/uL (ref 3.77–5.28)
RDW: 12.8 % (ref 11.7–15.4)
WBC: 3.5 10*3/uL (ref 3.4–10.8)

## 2020-08-10 LAB — CHRONIC URTICARIA: cu index: 3.4 (ref <10)

## 2020-08-10 LAB — ALLERGENS(7)
Brazil Nut IgE: <0.1 kU/L
F020-IgE Almond: <0.1 kU/L
F202-IgE Cashew Nut: <0.1 kU/L
Hazelnut (Filbert) IgE: <0.1 kU/L
Peanut IgE: 0.21 kU/L — AB
Pecan Nut IgE: <0.1 kU/L
Walnut IgE: 0.11 kU/L — AB

## 2020-08-10 LAB — ALPHA-GAL PANEL
Allergen Lamb IgE: <0.1 kU/L
Beef IgE: <0.1 kU/L
IgE (Immunoglobulin E), Serum: 565 IU/mL — ABNORMAL HIGH (ref 6–495)
O215-IgE Alpha-Gal: <0.1 kU/L
Pork IgE: <0.1 kU/L

## 2020-08-10 LAB — THYROID ANTIBODIES
Thyroglobulin Antibody: <1 IU/mL (ref 0.0–0.9)
Thyroperoxidase Ab SerPl-aCnc: <8 IU/mL (ref 0–34)

## 2020-08-10 LAB — SEDIMENTATION RATE: Sed Rate: 28 mm/hr (ref 0–32)

## 2020-08-10 LAB — TRYPTASE: Tryptase: 6 ug/L (ref 2.2–13.2)

## 2020-08-12 ENCOUNTER — Other Ambulatory Visit: Payer: Self-pay

## 2020-08-12 MED ORDER — EPINEPHRINE 0.3 MG/0.3ML IJ SOAJ: 0.3000 mg | Freq: Once | INTRAMUSCULAR | 1 refills | Status: AC

## 2021-10-14 ENCOUNTER — Encounter (HOSPITAL_COMMUNITY): Payer: Self-pay | Admitting: *Deleted

## 2021-10-14 ENCOUNTER — Inpatient Hospital Stay (HOSPITAL_COMMUNITY)
Admission: AD | Admit: 2021-10-14 | Discharge: 2021-10-14 | Disposition: A | Discharge status: Home / Self Care | Payer: Commercial Managed Care - HMO | Attending: Obstetrics and Gynecology | Admitting: Obstetrics and Gynecology

## 2021-10-14 DIAGNOSIS — O26891 Other specified pregnancy related conditions, first trimester: Secondary | ICD-10-CM | POA: Insufficient documentation

## 2021-10-14 DIAGNOSIS — O2341 Unspecified infection of urinary tract in pregnancy, first trimester: Secondary | ICD-10-CM | POA: Diagnosis not present

## 2021-10-14 DIAGNOSIS — O09521 Supervision of elderly multigravida, first trimester: Secondary | ICD-10-CM | POA: Insufficient documentation

## 2021-10-14 DIAGNOSIS — Z3A01 Less than 8 weeks gestation of pregnancy: Secondary | ICD-10-CM | POA: Diagnosis not present

## 2021-10-14 DIAGNOSIS — R300 Dysuria: Secondary | ICD-10-CM | POA: Diagnosis not present

## 2021-10-14 LAB — URINALYSIS, ROUTINE W REFLEX MICROSCOPIC
Bilirubin Urine: NEGATIVE
Glucose, UA: >=500 mg/dL — AB
Ketones, ur: NEGATIVE mg/dL
Leukocytes,Ua: NEGATIVE
Nitrite: NEGATIVE
Protein, ur: NEGATIVE mg/dL
Specific Gravity, Urine: 1.016 (ref 1.005–1.030)
pH: 5 (ref 5.0–8.0)

## 2021-10-14 LAB — POCT PREGNANCY, URINE: Preg Test, Ur: POSITIVE — AB

## 2021-10-14 LAB — WET PREP, GENITAL
Sperm: NONE SEEN
Trich, Wet Prep: NONE SEEN
WBC, Wet Prep HPF POC: >=10 — AB (ref <10)
Yeast Wet Prep HPF POC: NONE SEEN

## 2021-10-14 MED ORDER — CEFADROXIL 500 MG PO CAPS: 500.0000 mg | ORAL_CAPSULE | Freq: Two times a day (BID) | ORAL | 0 refills | Status: AC

## 2021-10-15 LAB — CULTURE, OB URINE: Culture: <10000 — AB

## 2021-10-17 LAB — GC/CHLAMYDIA PROBE AMP (~~LOC~~) NOT AT ARMC
Chlamydia: NEGATIVE
Comment: NEGATIVE
Comment: NORMAL
Neisseria Gonorrhea: NEGATIVE

## 2021-10-30 ENCOUNTER — Inpatient Hospital Stay (HOSPITAL_COMMUNITY): Payer: Commercial Managed Care - HMO

## 2021-10-30 ENCOUNTER — Inpatient Hospital Stay (HOSPITAL_COMMUNITY)
Admission: AD | Admit: 2021-10-30 | Discharge: 2021-10-30 | Disposition: A | Discharge status: Home / Self Care | Payer: Commercial Managed Care - HMO | Attending: Obstetrics and Gynecology | Admitting: Obstetrics and Gynecology

## 2021-10-30 DIAGNOSIS — Z3A08 8 weeks gestation of pregnancy: Secondary | ICD-10-CM | POA: Diagnosis not present

## 2021-10-30 DIAGNOSIS — R109 Unspecified abdominal pain: Secondary | ICD-10-CM | POA: Insufficient documentation

## 2021-10-30 DIAGNOSIS — O26851 Spotting complicating pregnancy, first trimester: Secondary | ICD-10-CM | POA: Insufficient documentation

## 2021-10-30 DIAGNOSIS — O3680X0 Pregnancy with inconclusive fetal viability, not applicable or unspecified: Secondary | ICD-10-CM | POA: Diagnosis not present

## 2021-10-30 DIAGNOSIS — O09521 Supervision of elderly multigravida, first trimester: Secondary | ICD-10-CM | POA: Insufficient documentation

## 2021-10-30 DIAGNOSIS — Z3A01 Less than 8 weeks gestation of pregnancy: Secondary | ICD-10-CM | POA: Diagnosis not present

## 2021-10-30 DIAGNOSIS — Z3491 Encounter for supervision of normal pregnancy, unspecified, first trimester: Secondary | ICD-10-CM

## 2021-10-30 DIAGNOSIS — O26891 Other specified pregnancy related conditions, first trimester: Secondary | ICD-10-CM | POA: Diagnosis not present

## 2021-10-30 LAB — WET PREP, GENITAL
Clue Cells Wet Prep HPF POC: NONE SEEN
Sperm: NONE SEEN
Trich, Wet Prep: NONE SEEN
WBC, Wet Prep HPF POC: <10 (ref <10)
Yeast Wet Prep HPF POC: NONE SEEN

## 2021-10-30 LAB — URINALYSIS, ROUTINE W REFLEX MICROSCOPIC
Bilirubin Urine: NEGATIVE
Glucose, UA: >=500 mg/dL — AB
Ketones, ur: NEGATIVE mg/dL
Leukocytes,Ua: NEGATIVE
Nitrite: NEGATIVE
Protein, ur: 30 mg/dL — AB
RBC / HPF: >50 RBC/hpf — ABNORMAL HIGH (ref 0–5)
Specific Gravity, Urine: 1.022 (ref 1.005–1.030)
pH: 5 (ref 5.0–8.0)

## 2021-10-30 LAB — CBC
HCT: 38.2 % (ref 36.0–46.0)
Hemoglobin: 13.1 g/dL (ref 12.0–15.0)
MCH: 29.4 pg (ref 26.0–34.0)
MCHC: 34.3 g/dL (ref 30.0–36.0)
MCV: 85.8 fL (ref 80.0–100.0)
Platelets: 196 10*3/uL (ref 150–400)
RBC: 4.45 MIL/uL (ref 3.87–5.11)
RDW: 12.4 % (ref 11.5–15.5)
WBC: 4.9 10*3/uL (ref 4.0–10.5)
nRBC: 0 % (ref 0.0–0.2)

## 2021-10-30 LAB — HCG, QUANTITATIVE, PREGNANCY: hCG, Beta Chain, Quant, S: 31128 m[IU]/mL — ABNORMAL HIGH (ref <5)

## 2021-10-30 NOTE — MAU Provider Note (Signed)
History     CSN: [id number]  Arrival date and time: 10/30/21 1741   Event Date/Time   First Provider Initiated Contact with Patient 10/30/21 2103      Chief Complaint  Patient presents with   Vaginal Bleeding   Abdominal Pain   HPI  Mariah Joyce is a 41 y.o. [id number] at [redacted]w[redacted]d who presents for evaluation of ***. Patient reports ***   Patient rates the pain as a ***/10 and {Has/has not:18111} tried {anything/nothing} for the pain.   She denies any {OBcomplaints:27511}. Denies any constipation, diarrhea or any urinary complaints. Reports normal fetal movement.   {GYN/OB [id number]  Past Medical History:  Diagnosis Date   Complication of anesthesia    GDM (gestational diabetes mellitus)     Past Surgical History:  Procedure Laterality Date   CESAREAN SECTION     for non-reassuring fhr    Family History  Problem Relation Age of Onset   Asthma Neg Hx    Eczema Neg Hx    Environmental Allergies Neg Hx     Social History   Tobacco Use   Smoking status: Never   Smokeless tobacco: Never  Substance Use Topics   Alcohol use: No   Drug use: No    Allergies:  Allergies  Allergen Reactions   Other Itching and Rash    ALL NUTS    Medications Prior to Admission  Medication Sig Dispense Refill Last Dose   cetirizine (ZYRTEC) 10 MG tablet Take 1 tablet (10 mg total) by mouth daily. 90 tablet 1    cetirizine (ZYRTEC) 10 MG tablet Take 1 tablet (10 mg total) by mouth 2 (two) times daily. 60 tablet 5    famotidine (PEPCID) 20 MG tablet Take 1 tablet (20 mg total) by mouth 2 (two) times daily. 60 tablet 5    fluticasone (FLONASE) 50 MCG/ACT nasal spray fluticasone propionate 50 mcg/actuation nasal spray,suspension  SHAKE LQ AND U 2 SPRAYS IEN D       Review of Systems Physical Exam   Blood pressure 131/87, pulse 79, temperature 98.3 F (36.8 C), temperature source Oral, resp. rate 15, last menstrual period 09/01/2021, SpO2 100 %, unknown if currently  breastfeeding.  Patient Vitals for the past 24 hrs:  BP Temp Temp src Pulse Resp SpO2  10/30/21 1843 131/87 98.3 F (36.8 C) Oral 79 15 100 %    Physical Exam  Fetal Tracing:  Baseline: Variability: Accels: Decels:  Toco:      MAU Course  Procedures  Results for orders placed or performed during the hospital encounter of 10/30/21 (from the past 24 hour(s))  Urinalysis, Routine w reflex microscopic Urine, Clean Catch     Status: Abnormal   Collection Time: 10/30/21  6:45 PM  Result Value Ref Range   Color, Urine YELLOW YELLOW   APPearance CLOUDY (A) CLEAR   Specific Gravity, Urine 1.022 1.005 - 1.030   pH 5.0 5.0 - 8.0   Glucose, UA >=500 (A) NEGATIVE mg/dL   Hgb urine dipstick LARGE (A) NEGATIVE   Bilirubin Urine NEGATIVE NEGATIVE   Ketones, ur NEGATIVE NEGATIVE mg/dL   Protein, ur 30 (A) NEGATIVE mg/dL   Nitrite NEGATIVE NEGATIVE   Leukocytes,Ua NEGATIVE NEGATIVE   RBC / HPF >50 (H) 0 - 5 RBC/hpf   WBC, UA 0-5 0 - 5 WBC/hpf   Bacteria, UA RARE (A) NONE SEEN   Squamous Epithelial / LPF 6-10 0 - 5  CBC     Status: None   Collection  Time: 10/30/21  6:53 PM  Result Value Ref Range   WBC 4.9 4.0 - 10.5 K/uL   RBC 4.45 3.87 - 5.11 MIL/uL   Hemoglobin 13.1 12.0 - 15.0 g/dL   HCT 33.3 54.5 - 62.5 %   MCV 85.8 80.0 - 100.0 fL   MCH 29.4 26.0 - 34.0 pg   MCHC 34.3 30.0 - 36.0 g/dL   RDW 63.8 93.7 - 34.2 %   Platelets 196 150 - 400 K/uL   nRBC 0.0 0.0 - 0.2 %  ABO/Rh     Status: None   Collection Time: 10/30/21  6:53 PM  Result Value Ref Range   ABO/RH(D)      O POS Performed at [facility], [address], [place], [place] [address]   hCG, quantitative, pregnancy     Status: Abnormal   Collection Time: 10/30/21  6:53 PM  Result Value Ref Range   hCG, Beta Chain, Quant, S 31,128 (H) <5 mIU/mL     US OB LESS THAN 14 WEEKS WITH OB TRANSVAGINAL  Result Date: 10/30/2021 CLINICAL DATA:  Bleeding. EXAM: OBSTETRIC <14 WK [place] AND TRANSVAGINAL OB US  TECHNIQUE: Both transabdominal and transvaginal ultrasound examinations were performed for complete evaluation of the gestation as well as the maternal uterus, adnexal regions, and pelvic cul-de-sac. Transvaginal technique was performed to assess early pregnancy. COMPARISON:  07/18/2013 FINDINGS: Intrauterine gestational sac: Single Yolk sac:  Yes Embryo:  Yes Cardiac Activity: No Heart Rate: None. CRL:  7.0 mm   6 w   4 d                  [place] EDC: 06/21/2022. Subchorionic hemorrhage: Small subchorionic hemorrhage anterior to the gestational sac measuring 2.3 x 0.4 x 2.6 cm. Maternal uterus/adnexae: A corpus luteal cyst is noted on the right. The left ovary is not visualized on exam. No free fluid in the pelvis. IMPRESSION: 1. Single intrauterine pregnancy with estimated gestational age of [redacted] weeks 4 days. Fetal pole and yolk sac are noted. No fetal cardiac activity is seen at this time and may be associated with early pregnancy versus failed early pregnancy. Short-term follow-up ultrasound and correlation with beta HCG is recommended. 2. Small subchorionic hemorrhage. Electronically Signed   By: Thornell Sartorius M.D.   On: 10/30/2021 20:44     MDM Prenatal records from community office {PrenatalRecords:27510}. Pregnancy complicated by *** {uncomplicated}. Labs ordered and reviewed.   ***  CNM independently reviewed the imaging ordered. Imaging show ***  CNM consulted with Dr. Marland Kitchen regarding presentation and results- MD recommends  Assessment and Plan  No diagnosis found.  -Discharge home in stable condition -Rx for *** -*** precautions discussed -Patient advised to follow-up with *** -Patient may return to MAU as needed or if her condition were to change or worsen  Rolm Bookbinder, CNM 10/30/2021, 9:04 PM

## 2021-10-30 NOTE — Discharge Instructions (Signed)

## 2021-10-30 NOTE — MAU Note (Signed)
Pt reports vag bleeding that started 2 hours ago with lower abd pain.  Endorses increased dc

## 2021-10-31 LAB — GC/CHLAMYDIA PROBE AMP (~~LOC~~) NOT AT ARMC
Chlamydia: NEGATIVE
Comment: NEGATIVE
Comment: NORMAL
Neisseria Gonorrhea: NEGATIVE

## 2021-10-31 LAB — ABO/RH: ABO/RH(D): O POS

## 2021-11-08 ENCOUNTER — Other Ambulatory Visit (INDEPENDENT_AMBULATORY_CARE_PROVIDER_SITE_OTHER): Payer: Commercial Managed Care - HMO

## 2021-11-08 ENCOUNTER — Ambulatory Visit (INDEPENDENT_AMBULATORY_CARE_PROVIDER_SITE_OTHER): Payer: Commercial Managed Care - HMO | Admitting: Advanced Practice Midwife

## 2021-11-08 ENCOUNTER — Other Ambulatory Visit: Payer: Self-pay | Admitting: Advanced Practice Midwife

## 2021-11-08 ENCOUNTER — Other Ambulatory Visit (HOSPITAL_COMMUNITY)
Admission: RE | Admit: 2021-11-08 | Discharge: 2021-11-08 | Disposition: A | Discharge status: Home / Self Care | Payer: Commercial Managed Care - HMO | Source: Ambulatory Visit | Attending: Advanced Practice Midwife | Admitting: Advanced Practice Midwife

## 2021-11-08 DIAGNOSIS — O3680X0 Pregnancy with inconclusive fetal viability, not applicable or unspecified: Secondary | ICD-10-CM

## 2021-11-08 DIAGNOSIS — N898 Other specified noninflammatory disorders of vagina: Secondary | ICD-10-CM | POA: Insufficient documentation

## 2021-11-08 DIAGNOSIS — R300 Dysuria: Secondary | ICD-10-CM

## 2021-11-08 DIAGNOSIS — B3731 Acute candidiasis of vulva and vagina: Secondary | ICD-10-CM

## 2021-11-08 DIAGNOSIS — O039 Complete or unspecified spontaneous abortion without complication: Secondary | ICD-10-CM

## 2021-11-08 MED ORDER — CONCEPT OB 130-92.4-1 MG PO CAPS: 1.0000 | ORAL_CAPSULE | Freq: Every day | ORAL | 12 refills | Status: DC

## 2021-11-08 NOTE — Progress Notes (Addendum)
Ultrasounds Results Note  SUBJECTIVE HPI:  [name] is a 41 y.o. [id number] at [redacted]w[redacted]d by LMP who presents to the [facility] for followup ultrasound results. The patient denies abdominal pain or vaginal bleeding.  Upon review of the patient's records, patient was first seen in MAU on 10/30/21 for vaginal bleeding.   BHCG on that day was 31, 000.  Ultrasound showed 6.4 week FP with No heart rate.  Had bleeding heavy than a period for about three days that stopped a few days ago. Denies dizziness, fatigue.  Repeat ultrasound was performed earlier today.   O POS  Live interpreter used.   Past Medical History:  Diagnosis Date   Complication of anesthesia    GDM (gestational diabetes mellitus)    Past Surgical History:  Procedure Laterality Date   CESAREAN SECTION     for non-reassuring fhr   Social History   Socioeconomic History   Marital status: Married    Spouse name: Not on file   Number of children: Not on file   Years of education: Not on file   Highest education level: Not on file  Occupational History   Not on file  Tobacco Use   Smoking status: Never   Smokeless tobacco: Never  Substance and Sexual Activity   Alcohol use: No   Drug use: No   Sexual activity: Yes  Other Topics Concern   Not on file  Social History Narrative   Not on file   Social Determinants of Health   Financial Resource Strain: Not on file  Food Insecurity: Not on file  Transportation Needs: Not on file  Physical Activity: Not on file  Stress: Not on file  Social Connections: Not on file  Intimate Partner Violence: Not on file   Current Outpatient Medications on File Prior to Visit  Medication Sig Dispense Refill   cetirizine (ZYRTEC) 10 MG tablet Take 1 tablet (10 mg total) by mouth daily. 90 tablet 1   cetirizine (ZYRTEC) 10 MG tablet Take 1 tablet (10 mg total) by mouth 2 (two) times daily. 60 tablet 5   famotidine (PEPCID) 20 MG tablet Take 1 tablet (20  mg total) by mouth 2 (two) times daily. 60 tablet 5   fluticasone (FLONASE) 50 MCG/ACT nasal spray fluticasone propionate 50 mcg/actuation nasal spray,suspension  SHAKE LQ AND U 2 SPRAYS IEN D     No current facility-administered medications on file prior to visit.   Allergies  Allergen Reactions   Other Itching and Rash    ALL NUTS    I have reviewed patient's Past Medical Hx, Surgical Hx, Family Hx, Social Hx, medications and allergies.   Review of Systems Review of Systems  Constitutional: Negative for fever and chills.  Gastrointestinal: Negative for nausea, vomiting, abdominal pain GU: Reports dysuria and vaginal irritation.  Musculoskeletal: Negative for back pain.  Neurological: Negative for dizziness and weakness.    Physical Exam  LMP 09/01/2021   GENERAL: Well-developed, well-nourished female in no acute distress.  HEENT: Normocephalic, atraumatic.   LUNGS: Effort normal HEART: Regular rate  SKIN: Warm, dry and without erythema PSYCH: Normal mood and affect NEURO: Alert and oriented x 4  LAB RESULTS No results found for this or any previous visit (from the past 24 hour(s)).  IMAGING [place] today show to GS, YS or FP consistent with completed AB.   ASSESSMENT 1. Pregnancy with inconclusive fetal viability, single or unspecified fetus  - US OB Transvaginal  2. Complete miscarriage -  Support given. Pt declines IBH  3. Vaginal irritation  - POCT UA - Glucose/Protein - Cervicovaginal ancillary only( [place])  4. Dysuria  - POCT UA - Glucose/Protein - Cervicovaginal ancillary only( [place])  PLAN Discharge home in stable condition Support given Offered IBH visit. declined F/U PRN  Will F/U labs as indicated.  Desires pregnancy ASAP. Recommend waiting one menstrual cycle before TTC. Continue PNVs. Rx sent to pharmacy.   Katrinka Blazing, [place], CNM  11/08/2021  10:26 AM

## 2021-11-08 NOTE — Addendum Note (Signed)
Addended by: Dorathy Kinsman on: 11/08/2021 05:15 PM   Modules accepted: Orders

## 2021-11-09 LAB — CERVICOVAGINAL ANCILLARY ONLY
Bacterial Vaginitis (gardnerella): NEGATIVE
Candida Glabrata: NEGATIVE
Candida Vaginitis: POSITIVE — AB
Chlamydia: NEGATIVE
Comment: NEGATIVE
Comment: NEGATIVE
Comment: NEGATIVE
Comment: NEGATIVE
Comment: NEGATIVE
Comment: NORMAL
Neisseria Gonorrhea: NEGATIVE
Trichomonas: NEGATIVE

## 2021-11-09 MED ORDER — FLUCONAZOLE 150 MG PO TABS: ORAL_TABLET | ORAL | 2 refills | Status: DC

## 2021-11-09 NOTE — Progress Notes (Signed)
Please call pt with Arabic interpreter. Please inform her that she tested positive for yeast infection. I am sending Diflucan to her pharmacy. Take one tablet now and a second tablet in three days if symptoms haven't resolved. Her UA did not look like she had a UTI but if symptoms continue, we should have her return for a urine culture.

## 2021-11-09 NOTE — Addendum Note (Signed)
Addended by: Dorathy Kinsman on: 11/09/2021 08:44 PM   Modules accepted: Orders

## 2021-11-10 ENCOUNTER — Telehealth: Payer: Self-pay | Admitting: *Deleted

## 2021-11-10 LAB — CULTURE, OB URINE

## 2021-11-10 LAB — URINE CULTURE, OB REFLEX

## 2021-11-10 MED ORDER — PRENATAL VITAMINS 28-0.8 MG PO TABS: 1.0000 | ORAL_TABLET | Freq: Every day | ORAL | 8 refills | Status: DC

## 2021-11-10 NOTE — Telephone Encounter (Addendum)
-----   Message from [place], [place] sent at 11/09/2021  8:44 PM EDT ----- Please call pt with Arabic interpreter. Please inform her that she tested positive for yeast infection. I am sending Diflucan to her pharmacy. Take one tablet now and a second tablet in three days if symptoms haven't resolved. Her UA did not look like she had a UTI but if symptoms continue, we should have her return for a urine culture.   7/20  1720  Called pt w/[employer] interpreter [phone] and she did not answer. A voicemail message was left stating that I am calling with test result information. Our staff will attempt to reach her tomorrow.

## 2021-11-10 NOTE — Addendum Note (Signed)
Addended by: Isabell Jarvis on: 11/10/2021 09:51 AM   Modules accepted: Orders

## 2021-11-11 NOTE — Telephone Encounter (Signed)
Call placed to pt with [facility] interpreter [facility] # [phone]. Pt did not answer. Left VM to return call to office for results and Rx.  Judeth Cornfield, RNC

## 2021-11-12 ENCOUNTER — Inpatient Hospital Stay (EMERGENCY_DEPARTMENT_HOSPITAL)
Admission: AD | Admit: 2021-11-12 | Discharge: 2021-11-13 | Disposition: A | Discharge status: Home / Self Care | Payer: Commercial Managed Care - HMO | Source: Home / Self Care | Attending: Obstetrics & Gynecology | Admitting: Obstetrics & Gynecology

## 2021-11-12 ENCOUNTER — Encounter (HOSPITAL_BASED_OUTPATIENT_CLINIC_OR_DEPARTMENT_OTHER): Payer: Self-pay | Admitting: Emergency Medicine

## 2021-11-12 ENCOUNTER — Encounter (HOSPITAL_COMMUNITY): Payer: Self-pay | Admitting: Obstetrics & Gynecology

## 2021-11-12 ENCOUNTER — Emergency Department (HOSPITAL_BASED_OUTPATIENT_CLINIC_OR_DEPARTMENT_OTHER)
Admission: EM | Admit: 2021-11-12 | Discharge: 2021-11-12 | Disposition: A | Discharge status: Home / Self Care | Payer: Commercial Managed Care - HMO | Attending: Emergency Medicine | Admitting: Emergency Medicine

## 2021-11-12 DIAGNOSIS — L292 Pruritus vulvae: Secondary | ICD-10-CM | POA: Insufficient documentation

## 2021-11-12 DIAGNOSIS — B3731 Acute candidiasis of vulva and vagina: Secondary | ICD-10-CM | POA: Insufficient documentation

## 2021-11-12 DIAGNOSIS — Z5321 Procedure and treatment not carried out due to patient leaving prior to being seen by health care provider: Secondary | ICD-10-CM | POA: Insufficient documentation

## 2021-11-12 DIAGNOSIS — O039 Complete or unspecified spontaneous abortion without complication: Secondary | ICD-10-CM | POA: Diagnosis not present

## 2021-11-12 LAB — WET PREP, GENITAL
Clue Cells Wet Prep HPF POC: NONE SEEN
Sperm: NONE SEEN
Trich, Wet Prep: NONE SEEN
WBC, Wet Prep HPF POC: <10 (ref <10)

## 2021-11-12 LAB — URINALYSIS, ROUTINE W REFLEX MICROSCOPIC
Bilirubin Urine: NEGATIVE
Glucose, UA: 150 mg/dL — AB
Ketones, ur: NEGATIVE mg/dL
Nitrite: NEGATIVE
Protein, ur: NEGATIVE mg/dL
Specific Gravity, Urine: 1.005 (ref 1.005–1.030)
pH: 6 (ref 5.0–8.0)

## 2021-11-12 NOTE — ED Triage Notes (Signed)
Pt here from home with c/o vaginal itching and discharge , has tried some OTC meds without relief

## 2021-11-12 NOTE — MAU Note (Signed)
.  Mariah Joyce is a 41 y.o. at [redacted]w[redacted]d here in MAU reporting: She reports that she had a miscarriage two weeks ago. that has vaginal itching and pain (10/10) that began three days ago. Reports small amount of vaginal discharge that is yellow.   Onset of complaint: three days ago Pain score: 10/10 Vitals:   11/12/21 2302  Pulse: 72  Resp: 16  Temp: 98.2 F (36.8 C)  SpO2: 100%

## 2021-11-13 DIAGNOSIS — O039 Complete or unspecified spontaneous abortion without complication: Secondary | ICD-10-CM

## 2021-11-13 DIAGNOSIS — B3731 Acute candidiasis of vulva and vagina: Secondary | ICD-10-CM | POA: Diagnosis not present

## 2021-11-13 MED ORDER — TERCONAZOLE 0.4 % VA CREA: 1.0000 | TOPICAL_CREAM | Freq: Every day | VAGINAL | 0 refills | Status: DC

## 2021-11-13 NOTE — MAU Provider Note (Signed)
  History     CSN: [id number]  Arrival date and time: 11/12/21 2240   None     Chief Complaint  Patient presents with   Vaginal Discharge   HPI Mariah Joyce is a 41 y.o. [id number] s/p complete miscarriage on 7/18 who presents to MAU for 3 days of vaginal itching and discharge. She denies vaginal bleeding or pain.   OB History     Gravida  6   Para  4   Term  3   Preterm  1   AB  1   Living  4      SAB  1   IAB  0   Ectopic  0   Multiple  0   Live Births  4           Past Medical History:  Diagnosis Date   Complication of anesthesia    GDM (gestational diabetes mellitus)     Past Surgical History:  Procedure Laterality Date   CESAREAN SECTION     for non-reassuring fhr    Family History  Problem Relation Age of Onset   Asthma Neg Hx    Eczema Neg Hx    Environmental Allergies Neg Hx     Social History   Tobacco Use   Smoking status: Never   Smokeless tobacco: Never  Substance Use Topics   Alcohol use: No   Drug use: No    Allergies:  Allergies  Allergen Reactions   Other Itching and Rash    ALL NUTS    No medications prior to admission.    Review of Systems  Constitutional: Negative.   Respiratory: Negative.    Cardiovascular: Negative.   Gastrointestinal: Negative.   Genitourinary:  Positive for vaginal discharge.       Itching   Physical Exam   Pulse 72, temperature 98.2 F (36.8 C), temperature source Oral, resp. rate 16, height 5\' 5"  (1.651 m), weight 76.7 kg, last menstrual period 09/01/2021, SpO2 100 %, unknown if currently breastfeeding.  Physical Exam Vitals and nursing note reviewed.  Constitutional:      General: She is not in acute distress. Eyes:     Extraocular Movements: Extraocular movements intact.     Pupils: Pupils are equal, round, and reactive to light.  Cardiovascular:     Rate and Rhythm: Normal rate.  Pulmonary:     Effort: Pulmonary effort is normal.  Genitourinary:     Comments: Patient self-swabbed  Musculoskeletal:        General: Normal range of motion.     Cervical back: Normal range of motion.  Skin:    General: Skin is warm and dry.  Neurological:     General: No focal deficit present.     Mental Status: She is alert and oriented to person, place, and time.  Psychiatric:        Mood and Affect: Mood normal.        Behavior: Behavior normal.        Judgment: Judgment normal.     MAU Course  Procedures  MDM Wet prep positive for yeast. Will send terazol cream to patient's pharmacy. Patient to follow up with [facility] as needed  Assessment and Plan  S/p complete miscarriage Yeast vaginitis  - Discharge home in stable condition - Rx for Terazol sent to pharmacy - Follow up at [facility]  - Return to MAU as needed for [facility] emergencies   [facility], CNM 11/13/2021, 3:18 AM

## 2021-11-13 NOTE — MAU Note (Signed)
Patient signed physical copy of AVS due to being discharged from the family room.  

## 2021-11-14 ENCOUNTER — Encounter (HOSPITAL_COMMUNITY): Payer: Self-pay | Admitting: Obstetrics & Gynecology

## 2021-11-14 LAB — CULTURE, OB URINE

## 2021-11-14 LAB — GC/CHLAMYDIA PROBE AMP (~~LOC~~) NOT AT ARMC
Chlamydia: NEGATIVE
Comment: NEGATIVE
Comment: NORMAL
Neisseria Gonorrhea: NEGATIVE

## 2021-11-14 NOTE — Telephone Encounter (Signed)
Pt did not respond to our attempts to notify her of test results and Rx sent to pharmacy. Per chart review, she was seen @ MAU on 7/22 for c/o vaginal itching and was treated for +yeast on wet prep.

## 2021-11-18 ENCOUNTER — Telehealth: Payer: Self-pay

## 2021-11-18 NOTE — Telephone Encounter (Signed)
-----   Message from [place], [place] sent at 11/17/2021  4:41 PM EDT ----- Please inform pt of normal urine culture and encourage her to active MyChart. Needs Arabic interpreter.

## 2021-11-18 NOTE — Telephone Encounter (Signed)
I attempted to call patient with the help of Arabic interpreter Samir ID [phone] from [employer]. Patient did not answer call, voicemail left stating patient's results were normal and to call us with any results. Also stated we encourage patient to activate her mychart account.   Mariah Richards, RN 11/18/21

## 2022-09-11 ENCOUNTER — Encounter (HOSPITAL_COMMUNITY): Payer: Self-pay | Admitting: Emergency Medicine

## 2022-09-11 ENCOUNTER — Ambulatory Visit (HOSPITAL_COMMUNITY)
Admission: EM | Admit: 2022-09-11 | Discharge: 2022-09-11 | Disposition: A | Discharge status: Home / Self Care | Payer: BLUE CROSS/BLUE SHIELD | Attending: Internal Medicine | Admitting: Internal Medicine

## 2022-09-11 ENCOUNTER — Other Ambulatory Visit: Payer: Self-pay

## 2022-09-11 DIAGNOSIS — R350 Frequency of micturition: Secondary | ICD-10-CM | POA: Diagnosis present

## 2022-09-11 DIAGNOSIS — R102 Pelvic and perineal pain: Secondary | ICD-10-CM | POA: Diagnosis present

## 2022-09-11 DIAGNOSIS — Z3201 Encounter for pregnancy test, result positive: Secondary | ICD-10-CM | POA: Insufficient documentation

## 2022-09-11 DIAGNOSIS — O26899 Other specified pregnancy related conditions, unspecified trimester: Secondary | ICD-10-CM | POA: Diagnosis present

## 2022-09-11 LAB — POCT URINALYSIS DIP (MANUAL ENTRY)
Glucose, UA: >=1000 mg/dL — AB
Ketones, POC UA: NEGATIVE mg/dL
Leukocytes, UA: NEGATIVE
Nitrite, UA: NEGATIVE
Protein Ur, POC: NEGATIVE mg/dL
Spec Grav, UA: 1.02 (ref 1.010–1.025)
Urobilinogen, UA: 0.2 E.U./dL
pH, UA: 5 (ref 5.0–8.0)

## 2022-09-11 LAB — POCT URINE PREGNANCY: Preg Test, Ur: POSITIVE — AB

## 2022-09-11 MED ORDER — PRENATAL VITAMINS 28-0.8 MG PO TABS: 1.0000 | ORAL_TABLET | Freq: Every day | ORAL | 1 refills | Status: DC

## 2022-09-11 NOTE — ED Provider Notes (Signed)
[facility]    CSN: [id number] Arrival date & time: 09/11/22  1001      History   Chief Complaint Chief Complaint  Patient presents with   Urinary Tract Infection    HPI Mariah Joyce is a 42 y.o. female.   Pleasant 42 year old female presents due to concerns of possible urinary tract infection.  She states for the past 2 weeks she has had some increase in urination.  She also ports slight burning.  She did pick up an over-the-counter cream from the pharmacy which did help with her symptoms.  She denies any vaginal discharge.  She states she has had bilateral ovarian discomfort for the past 4 to 5 months, worse over the past month.  Her last menstrual period was April 9, she is not currently on birth control or preventing pregnancy.  She reports some lower back discomfort as well.  She denies any vaginal bleeding. No fever or flank pain.   A language interpreter was used.  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 Authorizing Provider  cetirizine (ZYRTEC) 10 MG tablet Take 1 tablet (10 mg total) by mouth daily. 04/21/20   Wallis Bamberg, PA-C  cetirizine (ZYRTEC) 10 MG tablet Take 1 tablet (10 mg total) by mouth 2 (two) times daily. 07/28/20   Marcelyn Bruins, MD  famotidine (PEPCID) 20 MG tablet Take 1 tablet (20 mg total) by mouth 2 (two) times daily. 07/28/20   Marcelyn Bruins, MD  fluticasone (FLONASE) 50 MCG/ACT nasal spray fluticasone propionate 50 mcg/actuation nasal spray,suspension  SHAKE LQ AND U 2 SPRAYS IEN D    [provider]  Prenatal Vit-Fe Fumarate-FA (PRENATAL VITAMINS) 28-0.8 MG TABS Take 1 tablet by mouth daily. 09/11/22   Maretta Bees, PA    Family History Family History  Problem Relation Age of Onset   Asthma Neg Hx    Eczema Neg Hx    Environmental Allergies Neg Hx     Social History Social History   Tobacco Use   Smoking status: Never   Smokeless tobacco: Never  Vaping Use   Vaping Use: Never used  Substance Use Topics   Alcohol use: No   Drug use: No     Allergies   Other and Peanut allergen powder-dnfp   Review of Systems Review of Systems As per HPI  Physical Exam Triage Vital Signs ED Triage Vitals  Enc Vitals Group     BP 09/11/22 1208 112/73     Pulse Rate 09/11/22 1208 87     Resp 09/11/22 1208 18     Temp 09/11/22 1208 98.1 F (36.7 C)     Temp Source 09/11/22 1208 Oral     SpO2 09/11/22 1208 98 %     Weight --      Height --      Head Circumference --      Peak Flow --      Pain Score 09/11/22 1204 8     Pain Loc --      Pain Edu? --      Excl. in GC? --    No data found.  Updated Vital Signs BP 112/73 (BP Location: Right Arm)   Pulse 87   Temp 98.1 F (36.7 C) (Oral)   Resp 18   LMP 08/01/2022   SpO2 98%   Visual  Acuity Right Eye Distance:   Left Eye Distance:   Bilateral Distance:    Right Eye Near:   Left Eye Near:    Bilateral Near:     Physical Exam Vitals and nursing note reviewed.  Constitutional:      General: She is not in acute distress.    Appearance: Normal appearance. She is normal weight. She is not ill-appearing, toxic-appearing or diaphoretic.  HENT:     Head: Normocephalic and atraumatic.     Mouth/Throat:     Mouth: Mucous membranes are moist.  Eyes:     General: No scleral icterus.       Right eye: No discharge.        Left eye: No discharge.     Pupils: Pupils are equal, round, and reactive to light.  Cardiovascular:     Rate and Rhythm: Normal rate.  Pulmonary:     Effort: Pulmonary effort is normal. No respiratory distress.  Genitourinary:    Comments: Exam deferred Skin:    General: Skin is warm and dry.  Neurological:     General: No focal deficit present.     Mental Status: She is alert and oriented to person, place, and time.  Psychiatric:        Mood and Affect: Mood normal.        Behavior: Behavior normal.      UC Treatments / Results  Labs (all labs ordered are listed, but only abnormal results are displayed) Labs Reviewed  POCT URINE PREGNANCY - Abnormal; Notable for the following components:      Result Value   Preg Test, Ur Positive (*)    All other components within normal limits  POCT URINALYSIS DIP (MANUAL ENTRY) - Abnormal; Notable for the following components:   Glucose, UA >=1,000 (*)    Bilirubin, UA small (*)    Blood, UA small (*)    All other components within normal limits  URINE CULTURE    EKG   Radiology No results found.  Procedures Procedures (including critical care time)  Medications Ordered in UC Medications - No data to display  Initial Impression / Assessment and Plan / UC Course  I have reviewed the triage vital  signs and the nursing notes.  Pertinent labs & imaging results that were available during  my care of the patient were reviewed by me and considered in my medical decision making (see chart for details).     Pregnancy test positive - recommended pt establish care with a local OB.  Patient to take over-the-counter prenatals. Urinary frequency - Negative for signs of bacteria/ will send out culture for confirmation. Pelvic pressure - this is an early sign of pregnancy. Pt is not having any vaginal bleeding or other red flag s/sx concerning for infectious process. Will defer further workup to OB. ER precautions reviewed.   Final Clinical Impressions(s) / UC Diagnoses   Final diagnoses:  Pregnancy test positive  Urinary frequency  Pelvic pressure in pregnancy     Discharge Instructions      Your urine sample does not show signs of infection, we will send out a urine culture for confirmation. Your pregnancy test is positive. Please start taking a prenatal multivitamin. Please schedule follow-up evaluation with an obstetrician. If you develop any severe pelvic pain or vaginal bleeding, please head to the emergency room.   ED Prescriptions     Medication Sig Dispense Auth. Provider   Prenatal Vit-Fe Fumarate-FA (PRENATAL VITAMINS) 28-0.8 MG TABS Take 1 tablet by mouth daily. 90 tablet Suleyman Ehrman L, [place]      PDMP not reviewed this encounter.   Maretta Bees, [place] 09/11/22 1316

## 2022-09-11 NOTE — Discharge Instructions (Addendum)
Your urine sample does not show signs of infection, we will send out a urine culture for confirmation. Your pregnancy test is positive. Please start taking a prenatal multivitamin. Please schedule follow-up evaluation with an obstetrician. If you develop any severe pelvic pain or vaginal bleeding, please head to the emergency room.

## 2022-09-11 NOTE — ED Triage Notes (Addendum)
Use of interpreter: burning with urination. Picked up medicine from pharmacy ( cream bought over the counter) and now has pain in both side .  Denies vaginal discharge.  Lmp 4/9.  Sides have hurt for 2 months, burning with urination for 2 weeks

## 2022-09-12 LAB — URINE CULTURE: Culture: NO GROWTH

## 2022-10-18 ENCOUNTER — Encounter (HOSPITAL_COMMUNITY): Payer: Self-pay

## 2022-10-18 ENCOUNTER — Ambulatory Visit (HOSPITAL_COMMUNITY)
Admission: EM | Admit: 2022-10-18 | Discharge: 2022-10-18 | Disposition: A | Discharge status: Home / Self Care | Payer: BLUE CROSS/BLUE SHIELD | Attending: Family Medicine | Admitting: Family Medicine

## 2022-10-18 DIAGNOSIS — R7309 Other abnormal glucose: Secondary | ICD-10-CM

## 2022-10-18 DIAGNOSIS — N390 Urinary tract infection, site not specified: Secondary | ICD-10-CM | POA: Diagnosis not present

## 2022-10-18 DIAGNOSIS — N939 Abnormal uterine and vaginal bleeding, unspecified: Secondary | ICD-10-CM

## 2022-10-18 DIAGNOSIS — R319 Hematuria, unspecified: Secondary | ICD-10-CM | POA: Diagnosis not present

## 2022-10-18 DIAGNOSIS — R81 Glycosuria: Secondary | ICD-10-CM | POA: Diagnosis not present

## 2022-10-18 LAB — POCT URINALYSIS DIP (MANUAL ENTRY)
Glucose, UA: >=1000 mg/dL — AB
Nitrite, UA: NEGATIVE
Protein Ur, POC: >=300 mg/dL — AB
Spec Grav, UA: 1.01 (ref 1.010–1.025)
Urobilinogen, UA: 1 E.U./dL
pH, UA: 5 (ref 5.0–8.0)

## 2022-10-18 LAB — POCT URINE PREGNANCY: Preg Test, Ur: NEGATIVE

## 2022-10-18 LAB — HCG, QUANTITATIVE, PREGNANCY: hCG, Beta Chain, Quant, S: <1 m[IU]/mL (ref <5)

## 2022-10-18 LAB — POCT FASTING CBG KUC MANUAL ENTRY: POCT Glucose (KUC): 264 mg/dL — AB (ref 70–99)

## 2022-10-18 MED ORDER — FLUCONAZOLE 150 MG PO TABS: 150.0000 mg | ORAL_TABLET | ORAL | 0 refills | Status: DC | PRN

## 2022-10-18 MED ORDER — NITROFURANTOIN MONOHYD MACRO 100 MG PO CAPS: 100.0000 mg | ORAL_CAPSULE | Freq: Two times a day (BID) | ORAL | 0 refills | Status: AC

## 2022-10-18 NOTE — ED Provider Notes (Signed)
[facility]    CSN: [id number] Arrival date & time: 10/18/22  0803      History   Chief Complaint Chief Complaint  Patient presents with   Urinary Tract Infection   Vaginal Itching    HPI Mariah Joyce is a 42 y.o. female.   HPI Patient presents for evaluation for vaginal discharge. Patient see here on 09/11/2022 and was diagnosed with being pregnant and also UTI.  According to patient 2 weeks after being diagnosed as being pregnant she subsequently started her menstrual period at home and on 10/15/22 she has had a second menstrual period.  She has not been seen at the ER for vaginal bleeding and reports that she had a miscarriage in the past.  Patient was also treated for urinary tract infection during her visit on 09/11/2022.  She reports the symptoms resolved up until 3 days ago. Endorses having vaginal irritation and burning with urination.  Of note patient also has a history of gestational diabetes and reports that she never followed-up with a primary care doctor after her pregnancy in 2020.  Endorses ongoing urinary frequency and increased thirst. Last A1C on file > 1 year ago, 6.3.  Patient is requesting information to establish with an OB/GYN and a primary care doctor. 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 Authorizing Provider  fluconazole (DIFLUCAN) 150 MG tablet Take 1 tablet (150 mg total) by mouth every three (3) days as needed. 10/18/22  Yes Bing Neighbors, NP  nitrofurantoin, macrocrystal-monohydrate, (MACROBID) 100 MG capsule Take 1 capsule (100 mg total) by mouth 2 (two) times daily for 7 days. 10/18/22 10/25/22 Yes Bing Neighbors, NP  cetirizine (ZYRTEC) 10 MG tablet Take 1 tablet (10 mg total) by mouth daily. 04/21/20   Wallis Bamberg, PA-C  cetirizine (ZYRTEC) 10 MG tablet Take 1 tablet (10 mg total) by mouth 2 (two) times daily. 07/28/20   Marcelyn Bruins, MD  famotidine (PEPCID) 20 MG tablet Take 1 tablet (20 mg total) by mouth 2 (two) times daily. 07/28/20   Marcelyn Bruins, MD  fluticasone Aleda Grana) 50 MCG/ACT nasal  spray fluticasone propionate 50 mcg/actuation nasal spray,suspension  SHAKE LQ AND U 2 SPRAYS IEN D    [provider]  Prenatal Vit-Fe Fumarate-FA (PRENATAL VITAMINS) 28-0.8 MG TABS Take 1 tablet by mouth daily. 09/11/22   Maretta Bees, PA    Family History Family History  Problem Relation Age of Onset   Asthma Neg Hx    Eczema Neg Hx    Environmental Allergies Neg Hx     Social History Social History   Tobacco Use   Smoking status: Never   Smokeless tobacco: Never  Vaping Use   Vaping Use: Never used  Substance Use Topics   Alcohol use: No   Drug use: No     Allergies   Other and Peanut allergen powder-dnfp  Review of Systems Review of Systems Pertinent negatives listed in  HPI   Physical Exam Triage Vital Signs ED Triage Vitals  Enc Vitals Group     BP      Pulse      Resp      Temp      Temp src      SpO2      Weight      Height      Head Circumference      Peak Flow      Pain Score      Pain Loc      Pain Edu?      Excl. in GC?    No data found.  Updated Vital Signs BP 126/80 (BP Location: Left Arm)   Pulse 82   Temp 98.2 F (36.8 C) (Oral)   Resp 18   LMP 10/15/2022   SpO2 99%   Breastfeeding No   Visual Acuity Right Eye Distance:   Left Eye Distance:   Bilateral Distance:    Right Eye Near:   Left Eye Near:    Bilateral Near:     Physical Exam Constitutional:      Appearance: Normal appearance.  Eyes:     Extraocular Movements: Extraocular movements intact.     Pupils: Pupils are equal, round, and reactive to light.  Cardiovascular:     Rate and Rhythm: Normal rate and regular rhythm.  Pulmonary:     Effort: Pulmonary effort is normal.     Breath sounds: Normal breath sounds.  Abdominal:     Tenderness: There is right CVA tenderness and left CVA tenderness. There is no guarding or rebound.  Musculoskeletal:        General: Normal range of motion.  Skin:    General: Skin is warm and dry.  Neurological:     General: No focal deficit present.     Mental Status: She is alert.     UC Treatments / Results  Labs (all labs ordered are listed, but only abnormal results are displayed) Labs Reviewed  POCT URINALYSIS DIP (MANUAL ENTRY) - Abnormal; Notable for the following components:      Result Value   Color, UA red (*)    Clarity, UA turbid (*)    Glucose, UA >=1,000 (*)    Bilirubin, UA moderate (*)    Ketones, POC UA small (15) (*)    Blood, UA large (*)    Protein Ur, POC >=300 (*)    Leukocytes, UA Large (3+) (*)    All other components within normal limits  URINE CULTURE  HCG, QUANTITATIVE, PREGNANCY  HEMOGLOBIN A1C  POCT URINE PREGNANCY  POCT FASTING CBG KUC MANUAL ENTRY  EKG   Radiology No  results found.  Procedures Procedures (including critical care time)  Medications Ordered in UC Medications - No data to display  Initial Impression / Assessment and Plan / UC Course  I have reviewed the triage vital signs and the nursing notes.  Pertinent labs & imaging results that were available during my care of the patient were reviewed by me and considered in my medical decision making (see chart for details).    Urinary tract infection treated with Macrobid twice daily for 7 days and Diflucan for vaginal irritation.  Urine culture pending. 09/11/2022 patient tested positive for pregnancy however has had 2 episodes of vaginal bleeding since that time.  hCG urine is negative here today.  Obtaining a serum quantitative hCG to rule out pregnancy.  [facility]. Glucose found in urine and a history of an elevated A1c, hemoglobin A1c is pending.  Patient has a history of gestational diabetes and has been seen twice for dysuria and urinary frequency.  Today UA indicates a urinary tract infection and vaginitis symptoms may be related to undertreated diabetes.  Will await A1c.  Patient advised that we will notify her if any of her lab results are abnormal.  Patient also scheduled with a primary care appointment.  Return precautions given if symptoms worsen or do not improve. Final Clinical Impressions(s) / UC Diagnoses   Final diagnoses:  Urinary tract infection with hematuria, site unspecified  Glucose found in urine on examination  Elevated hemoglobin A1chx     Discharge Instructions      Labs are pending.  We will notify you if your lab results require any additional treatment.  I am treating you for urinary tract infection.  Start nitrofurantoin take 1 tablet twice daily for the next 7 days.  For vaginal itching start Diflucan take 1 dose today may repeat every 3 days until symptoms resolve.  I placed a referral for  you to be evaluated by an OB/GYN.  Their office will contact you by phone or you may call their office tomorrow to schedule an appointment.     ED Prescriptions     Medication Sig Dispense Auth. Provider   nitrofurantoin, macrocrystal-monohydrate, (MACROBID) 100 MG capsule Take 1 capsule (100 mg total) by mouth 2 (two) times daily for 7 days. 14 capsule Bing Neighbors, NP   fluconazole (DIFLUCAN) 150 MG tablet Take 1 tablet (150 mg total) by mouth every three (3) days as needed. 3 tablet Bing Neighbors, NP      PDMP not reviewed this encounter.   Bing Neighbors, NP 10/18/22 [phone]

## 2022-10-18 NOTE — Discharge Instructions (Addendum)
Labs are pending.  We will notify you if your lab results require any additional treatment.  I am treating you for urinary tract infection.  Start nitrofurantoin take 1 tablet twice daily for the next 7 days.  For vaginal itching start Diflucan take 1 dose today may repeat every 3 days until symptoms resolve.  I placed a referral for you to be evaluated by an OB/GYN.  Their office will contact you by phone or you may call their office tomorrow to schedule an appointment.

## 2022-10-18 NOTE — ED Triage Notes (Signed)
Per interpretor, pt c/o burning on urination with urgency/frequency and vaginal itching x3 days and started her menstrual cycle the same day.

## 2022-10-19 ENCOUNTER — Other Ambulatory Visit (HOSPITAL_COMMUNITY): Payer: Self-pay | Admitting: Family Medicine

## 2022-10-19 LAB — HEMOGLOBIN A1C
Hgb A1c MFr Bld: 9.6 % — ABNORMAL HIGH (ref 4.8–5.6)
Mean Plasma Glucose: 229 mg/dL

## 2022-10-19 LAB — URINE CULTURE
Culture: 30000 — AB
Special Requests: NORMAL

## 2022-10-19 MED ORDER — GLIPIZIDE ER 5 MG PO TB24: 5.0000 mg | ORAL_TABLET | Freq: Every day | ORAL | 0 refills | Status: DC

## 2022-10-19 NOTE — Progress Notes (Signed)
It has been multiple times for UTI symptoms however UTI cultures have been negative x 2.  Patient seen yesterday obtained a hemoglobin A1c which resulted as 9.6 patient has a history of gestational diabetes and is now type II diabetic.  Sending over glipizide 5 mg XL to take daily with food until patient sees her PCP on 8/2/204.

## 2022-10-25 ENCOUNTER — Telehealth (HOSPITAL_COMMUNITY): Payer: Self-pay | Admitting: Emergency Medicine

## 2022-10-25 NOTE — Telephone Encounter (Signed)
Reviewed recent results and provider recommendations with patient and spouse, all questions answered

## 2022-11-24 ENCOUNTER — Ambulatory Visit (INDEPENDENT_AMBULATORY_CARE_PROVIDER_SITE_OTHER): Payer: BLUE CROSS/BLUE SHIELD | Admitting: Nurse Practitioner

## 2022-11-24 ENCOUNTER — Encounter: Payer: Self-pay | Admitting: Nurse Practitioner

## 2022-11-24 VITALS — BP 118/70 | HR 75 | Temp 97.6°F | Ht 62.0 in | Wt 166.6 lb

## 2022-11-24 DIAGNOSIS — G43821 Menstrual migraine, not intractable, with status migrainosus: Secondary | ICD-10-CM | POA: Diagnosis not present

## 2022-11-24 DIAGNOSIS — E1165 Type 2 diabetes mellitus with hyperglycemia: Secondary | ICD-10-CM | POA: Diagnosis not present

## 2022-11-24 DIAGNOSIS — G43829 Menstrual migraine, not intractable, without status migrainosus: Secondary | ICD-10-CM | POA: Insufficient documentation

## 2022-11-24 HISTORY — DX: Menstrual migraine, not intractable, with status migrainosus: G43.821

## 2022-11-24 LAB — POCT GLYCOSYLATED HEMOGLOBIN (HGB A1C): Hemoglobin A1C: 7.8 % — AB (ref 4.0–5.6)

## 2022-11-24 MED ORDER — ACCU-CHEK SOFTCLIX LANCETS MISC: 12 refills | Status: AC

## 2022-11-24 MED ORDER — ACCU-CHEK GUIDE ME W/DEVICE KIT: 1.0000 | PACK | Freq: Two times a day (BID) | 0 refills | Status: AC

## 2022-11-24 MED ORDER — ACCU-CHEK GUIDE VI STRP: ORAL_STRIP | 12 refills | Status: AC

## 2022-11-24 MED ORDER — IBUPROFEN 400 MG PO TABS
400.0000 mg | ORAL_TABLET | Freq: Four times a day (QID) | ORAL | 0 refills | Status: DC | PRN
Start: 2022-11-24 — End: 2023-12-05

## 2022-11-24 MED ORDER — GLIPIZIDE ER 5 MG PO TB24
5.0000 mg | ORAL_TABLET | Freq: Every day | ORAL | 1 refills | Status: DC
Start: 2022-11-24 — End: 2022-12-19

## 2022-11-24 NOTE — Assessment & Plan Note (Addendum)
Lab Results  Component Value Date   HGBA1C 7.8 (A) 11/24/2022  Was started on glipizide 5 mg daily about a month ago Continue current medication Patient counseled on low-carb modified diet Signs and symptoms of hypoglycemia and treatment of hypoglycemia discussed CBG meter ordered blood pressure goals were discussed Checking urine microalbumin labs, lipid panel Follow-up in 3 month Patient referred for diabetic eye exam Diabetic foot exam completed in the office today

## 2022-11-24 NOTE — Patient Instructions (Signed)
  Goal for fasting blood sugar ranges from 80 to 120 and 2 hours after any meal or at bedtime should be between 130 to 170.    1. Uncontrolled type 2 diabetes mellitus with hyperglycemia (HCC)  - Microalbumin / creatinine urine ratio - CMP14+EGFR - POCT glycosylated hemoglobin (Hb A1C) - glipiZIDE (GLUCOTROL XL) 5 MG 24 hr tablet; Take 1 tablet (5 mg total) by mouth daily with breakfast.  Dispense: 90 tablet; Refill: 1 - Lipid panel - Ambulatory referral to Ophthalmology  2. Menstrual migraine with status migrainosus, not intractable  - ibuprofen (ADVIL) 400 MG tablet; Take 1 tablet (400 mg total) by mouth every 6 (six) hours as needed.  Dispense: 30 tablet; Refill: 0    It is important that you exercise regularly at least 30 minutes 5 times a week as tolerated  Think about what you will eat, plan ahead. Choose " clean, green, fresh or frozen" over canned, processed or packaged foods which are more sugary, salty and fatty. 70 to 75% of food eaten should be vegetables and fruit. Three meals at set times with snacks allowed between meals, but they must be fruit or vegetables. Aim to eat over a 12 hour period , example 7 am to 7 pm, and STOP after  your last meal of the day. Drink water,generally about 64 ounces per day, no other drink is as healthy. Fruit juice is best enjoyed in a healthy way, by EATING the fruit.  Thanks for choosing [facility] we consider it a privelige to serve you.

## 2022-11-24 NOTE — Progress Notes (Signed)
New Patient Office Visit  Subjective:  Patient ID: Mariah Joyce, female    DOB: 09/01/1980  Age: 42 y.o. MRN: [id number]  CC:  Chief Complaint  Patient presents with   Establish Care    fasting    HPI Mariah Joyce is a 42 y.o. female  has a past medical history of Complication of anesthesia, GDM (gestational diabetes mellitus), and Type 2 diabetes mellitus (HCC).  Patient presents to establish care for her chronic medical condition.  Uncontrolled type 2 diabetes.  Currently on glipizide 5 mg daily, stated that she has been taking the medication daily as ordered by the ER doctors. she reports polyuria, polydipsia, polyphagia.  She does not have goal glucose meter so she has not been checking her blood sugar, she has started doing walking exercises and also following a low-carb diet.   Patient complains of migraine headaches around her menstrual cycles.  She has been taking Tylenol as needed, states that Tylenol does not help much, she denies nausea, vomiting, no complaints of numbness, tingling, seizures  She is accompanied by her daughter and a medical interpreter.  Medical interpreter assisted with translation    Past Medical History:  Diagnosis Date   Complication of anesthesia    GDM (gestational diabetes mellitus)    Type 2 diabetes mellitus (HCC)     Past Surgical History:  Procedure Laterality Date   CESAREAN SECTION     for non-reassuring fhr    Family History  Problem Relation Age of Onset   Asthma Neg Hx    Eczema Neg Hx    Environmental Allergies Neg Hx     Social History   Socioeconomic History   Marital status: Married    Spouse name: Not on file   Number of children: 4   Years of education: Not on file   Highest education level: Not on file  Occupational History   Not on file  Tobacco Use   Smoking status: Never   Smokeless tobacco: Never  Vaping Use   Vaping status: Never Used  Substance and Sexual Activity    Alcohol use: No   Drug use: No   Sexual activity: Not Currently    Birth control/protection: None  Other Topics Concern   Not on file  Social History Narrative   Lives with her husband and children    Social Determinants of Health   Financial Resource Strain: Not on file  Food Insecurity: Not on file  Transportation Needs: Not on file  Physical Activity: Not on file  Stress: Not on file  Social Connections: Not on file  Intimate Partner Violence: Not on file    ROS Review of Systems  Constitutional:  Negative for activity change, appetite change, chills, fatigue and fever.  HENT:  Negative for congestion, dental problem, ear discharge, ear pain, hearing loss, rhinorrhea, sinus pressure, sinus pain, sneezing and sore throat.   Eyes:  Negative for pain, discharge, redness and itching.  Respiratory:  Negative for cough, chest tightness, shortness of breath and wheezing.   Cardiovascular:  Negative for chest pain, palpitations and leg swelling.  Gastrointestinal:  Negative for abdominal distention, abdominal pain, anal bleeding, blood in stool, constipation, diarrhea, nausea, rectal pain and vomiting.  Endocrine: Positive for polydipsia, polyphagia and polyuria. Negative for cold intolerance and heat intolerance.  Genitourinary:  Negative for difficulty urinating, dysuria, flank pain, frequency, hematuria, menstrual problem, pelvic pain and vaginal bleeding.  Musculoskeletal:  Negative for arthralgias, back pain, gait problem, joint swelling  and myalgias.  Skin:  Negative for color change, pallor, rash and wound.  Allergic/Immunologic: Negative for environmental allergies, food allergies and immunocompromised state.  Neurological:  Negative for dizziness, tremors, facial asymmetry, weakness and headaches.  Hematological:  Negative for adenopathy. Does not bruise/bleed easily.  Psychiatric/Behavioral:  Negative for agitation, behavioral problems, confusion, decreased concentration,  hallucinations, self-injury and suicidal ideas.     Objective:   Today's Vitals: BP 118/70   Pulse 75   Temp 97.6 F (36.4 C)   Ht 5\' 2"  (1.575 m)   Wt 166 lb 9.6 oz (75.6 kg)   SpO2 99%   BMI 30.47 kg/m   Physical Exam Vitals and nursing note reviewed.  Constitutional:      General: She is not in acute distress.    Appearance: Normal appearance. She is obese. She is not ill-appearing, toxic-appearing or diaphoretic.  HENT:     Mouth/Throat:     Mouth: Mucous membranes are moist.     Pharynx: Oropharynx is clear. No oropharyngeal exudate or posterior oropharyngeal erythema.  Eyes:     General: No scleral icterus.       Right eye: No discharge.        Left eye: No discharge.     Extraocular Movements: Extraocular movements intact.     Conjunctiva/sclera: Conjunctivae normal.  Cardiovascular:     Rate and Rhythm: Normal rate and regular rhythm.     Pulses: Normal pulses.     Heart sounds: Normal heart sounds. No murmur heard.    No friction rub. No gallop.  Pulmonary:     Effort: Pulmonary effort is normal. No respiratory distress.     Breath sounds: Normal breath sounds. No stridor. No wheezing, rhonchi or rales.  Chest:     Chest wall: No tenderness.  Abdominal:     General: There is no distension.     Palpations: Abdomen is soft.     Tenderness: There is no abdominal tenderness. There is no right CVA tenderness, left CVA tenderness or guarding.  Musculoskeletal:        General: No swelling, tenderness, deformity or signs of injury.     Right lower leg: No edema.     Left lower leg: No edema.  Skin:    General: Skin is warm and dry.     Capillary Refill: Capillary refill takes less than 2 seconds.     Coloration: Skin is not jaundiced or pale.     Findings: No bruising, erythema or lesion.  Neurological:     Mental Status: She is alert and oriented to person, place, and time.     Motor: No weakness.     Coordination: Coordination normal.     Gait: Gait normal.   Psychiatric:        Mood and Affect: Mood normal.        Behavior: Behavior normal.        Thought Content: Thought content normal.        Judgment: Judgment normal.     Assessment & Plan:   Problem List Items Addressed This Visit       Cardiovascular and Mediastinum   Menstrual migraine with status migrainosus, not intractable    Ibuprofen 400 mg every 6 hours as needed ordered Encouraged to take ibuprofen with meals to decrease GI upset       Relevant Medications   ibuprofen (ADVIL) 400 MG tablet     Endocrine   Uncontrolled type 2 diabetes mellitus with hyperglycemia (HCC) -  Primary    Lab Results  Component Value Date   HGBA1C 7.8 (A) 11/24/2022  Was started on glipizide 5 mg daily about a month ago Continue current medication Patient counseled on low-carb modified diet Signs and symptoms of hypoglycemia and treatment of hypoglycemia discussed CBG meter ordered blood pressure goals were discussed Checking urine microalbumin labs, lipid panel Follow-up in 3 month Patient referred for diabetic eye exam Diabetic foot exam completed in the office today      Relevant Medications   glipiZIDE (GLUCOTROL XL) 5 MG 24 hr tablet   Other Relevant Orders   Microalbumin / creatinine urine ratio   CMP14+EGFR   POCT glycosylated hemoglobin (Hb A1C) (Completed)   Lipid panel   Ambulatory referral to Ophthalmology    Outpatient Encounter Medications as of 11/24/2022  Medication Sig   Accu-Chek Softclix Lancets lancets Use as instructed to check blood sugars bid   Blood Glucose Monitoring Suppl (ACCU-CHEK GUIDE ME) w/Device KIT 1 each by Does not apply route in the morning and at bedtime.   cetirizine (ZYRTEC) 10 MG tablet Take 1 tablet (10 mg total) by mouth daily.   famotidine (PEPCID) 20 MG tablet Take 1 tablet (20 mg total) by mouth 2 (two) times daily.   glucose blood (ACCU-CHEK GUIDE) test strip Use as instructed to check blood sugars bid   ibuprofen (ADVIL) 400 MG  tablet Take 1 tablet (400 mg total) by mouth every 6 (six) hours as needed.   [DISCONTINUED] glipiZIDE (GLUCOTROL XL) 5 MG 24 hr tablet Take 1 tablet (5 mg total) by mouth daily with breakfast.   cetirizine (ZYRTEC) 10 MG tablet Take 1 tablet (10 mg total) by mouth 2 (two) times daily.   fluconazole (DIFLUCAN) 150 MG tablet Take 1 tablet (150 mg total) by mouth every three (3) days as needed. (Patient not taking: Reported on 11/24/2022)   fluticasone (FLONASE) 50 MCG/ACT nasal spray fluticasone propionate 50 mcg/actuation nasal spray,suspension  SHAKE LQ AND U 2 SPRAYS IEN D (Patient not taking: Reported on 11/24/2022)   glipiZIDE (GLUCOTROL XL) 5 MG 24 hr tablet Take 1 tablet (5 mg total) by mouth daily with breakfast.   [DISCONTINUED] Prenatal Vit-Fe Fumarate-FA (PRENATAL VITAMINS) 28-0.8 MG TABS Take 1 tablet by mouth daily. (Patient not taking: Reported on 11/24/2022)   No facility-administered encounter medications on file as of 11/24/2022.    Follow-up: Return in about 3 months (around 02/24/2023) for DM.   Donell Beers, FNP

## 2022-11-24 NOTE — Assessment & Plan Note (Signed)
Ibuprofen 400 mg every 6 hours as needed ordered Encouraged to take ibuprofen with meals to decrease GI upset

## 2022-11-27 ENCOUNTER — Other Ambulatory Visit: Payer: Self-pay | Admitting: Nurse Practitioner

## 2022-11-27 DIAGNOSIS — E785 Hyperlipidemia, unspecified: Secondary | ICD-10-CM

## 2022-11-27 MED ORDER — ROSUVASTATIN CALCIUM 5 MG PO TABS
5.0000 mg | ORAL_TABLET | Freq: Every day | ORAL | 0 refills | Status: DC
Start: 2022-11-27 — End: 2022-12-19

## 2022-12-12 ENCOUNTER — Ambulatory Visit: Payer: BLUE CROSS/BLUE SHIELD | Admitting: Family Medicine

## 2022-12-12 ENCOUNTER — Encounter: Payer: Self-pay | Admitting: Family Medicine

## 2022-12-12 ENCOUNTER — Other Ambulatory Visit: Payer: Self-pay

## 2022-12-12 ENCOUNTER — Other Ambulatory Visit (HOSPITAL_COMMUNITY)
Admission: RE | Admit: 2022-12-12 | Discharge: 2022-12-12 | Disposition: A | Discharge status: Home / Self Care | Payer: BLUE CROSS/BLUE SHIELD | Source: Ambulatory Visit | Attending: Family Medicine | Admitting: Family Medicine

## 2022-12-12 VITALS — BP 134/86 | HR 92 | Ht 61.0 in | Wt 165.0 lb

## 2022-12-12 DIAGNOSIS — Z124 Encounter for screening for malignant neoplasm of cervix: Secondary | ICD-10-CM | POA: Insufficient documentation

## 2022-12-12 DIAGNOSIS — Z01419 Encounter for gynecological examination (general) (routine) without abnormal findings: Secondary | ICD-10-CM | POA: Insufficient documentation

## 2022-12-12 DIAGNOSIS — Z3169 Encounter for other general counseling and advice on procreation: Secondary | ICD-10-CM

## 2022-12-12 DIAGNOSIS — Z1231 Encounter for screening mammogram for malignant neoplasm of breast: Secondary | ICD-10-CM | POA: Diagnosis not present

## 2022-12-12 DIAGNOSIS — N898 Other specified noninflammatory disorders of vagina: Secondary | ICD-10-CM

## 2022-12-12 DIAGNOSIS — Z603 Acculturation difficulty: Secondary | ICD-10-CM

## 2022-12-12 DIAGNOSIS — Z758 Other problems related to medical facilities and other health care: Secondary | ICD-10-CM | POA: Diagnosis not present

## 2022-12-12 LAB — POCT URINALYSIS DIP (DEVICE)
Bilirubin Urine: NEGATIVE
Glucose, UA: 100 mg/dL — AB
Ketones, ur: NEGATIVE mg/dL
Leukocytes,Ua: NEGATIVE
Nitrite: NEGATIVE
Protein, ur: NEGATIVE mg/dL
Specific Gravity, Urine: 1.01 (ref 1.005–1.030)
Urobilinogen, UA: 0.2 mg/dL (ref 0.0–1.0)
pH: 6.5 (ref 5.0–8.0)

## 2022-12-12 NOTE — Progress Notes (Signed)
Pt states having pain and itching on ovulation days. Having normal regular monthly cycles. Has not taken home UPT since May 2024. States had SAB at home in May. Did not go to hospital. Has not had any AUB outside of cycle days. States was diagnosed with DM2 2 months ago.

## 2022-12-12 NOTE — Progress Notes (Signed)
Subjective:     Mariah Joyce is a 42 y.o. female and is here for a comprehensive physical exam. The patient reports problems - vaginal itching. Has had 2 SABs in last 1 year. Strongly desires pregnancy. Recent T2DM diagnosis on oral meds.  The following portions of the patient's history were reviewed and updated as appropriate: allergies, current medications, past family history, past medical history, past social history, past surgical history, and problem list.  Review of Systems Pertinent items noted in HPI and remainder of comprehensive ROS otherwise negative.   Objective:  Chaperone present for exam   BP 134/86   Pulse 92   Ht 5\' 1"  (1.549 m)   Wt 165 lb (74.8 kg)   LMP 11/28/2022 (Exact Date)   BMI 31.18 kg/m  General appearance: alert, cooperative, and appears stated age Head: Normocephalic, without obvious abnormality, atraumatic Neck: no adenopathy, supple, symmetrical, trachea midline, and thyroid not enlarged, symmetric, no tenderness/mass/nodules Breasts: normal appearance, no masses or tenderness Heart: regular rate and rhythm, S1, S2 normal, no murmur, click, rub or gallop Abdomen: soft, non-tender; bowel sounds normal; no masses,  no organomegaly Pelvic: cervix normal in appearance, external genitalia normal, no adnexal masses or tenderness, no cervical motion tenderness, uterus normal size, shape, and consistency, and vagina normal without discharge Extremities: extremities normal, atraumatic, no cyanosis or edema Pulses: 2+ and symmetric Skin: Skin color, texture, turgor normal. No rashes or lesions Lymph nodes: Cervical, supraclavicular, and axillary nodes normal. Neurologic: Grossly normal    Urinalysis    Component Value Date/Time   COLORURINE YELLOW 11/12/2021 2318   APPEARANCEUR CLOUDY (A) 11/12/2021 2318   APPEARANCEUR Cloudy (A) 06/29/2016 1230   LABSPEC 1.010 12/12/2022 1140   PHURINE 6.5 12/12/2022 1140   GLUCOSEU 100 (A) 12/12/2022 1140    HGBUR TRACE (A) 12/12/2022 1140   BILIRUBINUR NEGATIVE 12/12/2022 1140   BILIRUBINUR moderate (A) 10/18/2022 0846   BILIRUBINUR Negative 06/29/2016 1230   KETONESUR NEGATIVE 12/12/2022 1140   PROTEINUR NEGATIVE 12/12/2022 1140   UROBILINOGEN 0.2 12/12/2022 1140   NITRITE NEGATIVE 12/12/2022 1140   LEUKOCYTESUR NEGATIVE 12/12/2022 1140     Assessment:    Healthy female exam.      Plan:  Encounter for gynecological examination without abnormal finding  Language barrier  Screening for malignant neoplasm of cervix - Plan: Cytology - PAP( [name])  Encounter for screening mammogram for malignant neoplasm of breast - Plan: MM 3D SCREENING MAMMOGRAM BILATERAL BREAST  Vagina itching - Plan: POCT urinalysis dip (device), Cervicovaginal ancillary only( )  Encounter for preconception consultation - discussed glycemic control and age as possible explanation for SABs. Needs good control and start PNVs. Arabic interpreter: in person used    See After Visit Summary for Counseling Recommendations

## 2022-12-13 LAB — CYTOLOGY - PAP
Adequacy: ABSENT
Chlamydia: NEGATIVE
Comment: NEGATIVE
Comment: NEGATIVE
Comment: NEGATIVE
Comment: NORMAL
Diagnosis: NEGATIVE
High risk HPV: NEGATIVE
Neisseria Gonorrhea: NEGATIVE
Trichomonas: NEGATIVE

## 2022-12-13 LAB — CERVICOVAGINAL ANCILLARY ONLY
Bacterial Vaginitis (gardnerella): NEGATIVE
Candida Glabrata: NEGATIVE
Candida Vaginitis: POSITIVE — AB
Comment: NEGATIVE
Comment: NEGATIVE
Comment: NEGATIVE

## 2022-12-14 MED ORDER — FLUCONAZOLE 150 MG PO TABS
150.0000 mg | ORAL_TABLET | Freq: Every day | ORAL | 2 refills | Status: DC
Start: 2022-12-14 — End: 2022-12-19

## 2022-12-14 NOTE — Addendum Note (Signed)
Addended by: Reva Bores on: 12/14/2022 08:00 AM   Modules accepted: Orders

## 2022-12-15 NOTE — Telephone Encounter (Signed)
LMOM because patient has left [place] a message.

## 2022-12-15 NOTE — Telephone Encounter (Signed)
Called pt to inform her of message from provider, no answer, left VM to return call

## 2022-12-19 ENCOUNTER — Encounter: Payer: Self-pay | Admitting: Nurse Practitioner

## 2022-12-19 ENCOUNTER — Ambulatory Visit (INDEPENDENT_AMBULATORY_CARE_PROVIDER_SITE_OTHER): Payer: BLUE CROSS/BLUE SHIELD | Admitting: Nurse Practitioner

## 2022-12-19 VITALS — BP 111/72 | HR 79 | Temp 97.2°F

## 2022-12-19 DIAGNOSIS — Z319 Encounter for procreative management, unspecified: Secondary | ICD-10-CM | POA: Diagnosis not present

## 2022-12-19 DIAGNOSIS — E1165 Type 2 diabetes mellitus with hyperglycemia: Secondary | ICD-10-CM

## 2022-12-19 MED ORDER — INSULIN PEN NEEDLE 32G X 4 MM MISC
1.0000 | Freq: Every day | 3 refills | Status: DC
Start: 2022-12-19 — End: 2023-12-07

## 2022-12-19 MED ORDER — LANTUS SOLOSTAR 100 UNIT/ML ~~LOC~~ SOPN
10.0000 [IU] | PEN_INJECTOR | Freq: Every day | SUBCUTANEOUS | 99 refills | Status: DC
Start: 2022-12-19 — End: 2023-03-12

## 2022-12-19 MED ORDER — INSULIN GLARGINE 100 UNIT/ML ~~LOC~~ SOLN
10.0000 [IU] | Freq: Every day | SUBCUTANEOUS | 11 refills | Status: DC
Start: 2022-12-19 — End: 2022-12-19

## 2022-12-19 NOTE — Patient Instructions (Addendum)
1. Uncontrolled type 2 diabetes mellitus with hyperglycemia (HCC)  - Ambulatory referral to diabetic education - insulin glargine (LANTUS SOLOSTAR) 100 UNIT/ML Solostar Pen; Inject 10 Units into the skin daily.  Dispense: 15 mL; Refill: PRN      Goal for fasting blood sugar ranges from 80 to 120 and 2 hours after any meal or at bedtime should be between 130 to 170.    Carry rapidly absorbed carbohydrate source at all times Treats low glucose less than 70 as per rule of 15's.  Give 15 g of rapidly absorbed carbohydrate i.e. half cup juice or 4 glucose tabs.  Recheck glucose level in 15 minutes.  Give another 15 g of carbohydrate if glucose still less than 70.  Repeat until glucose level is greater than 70.  Once glucose level returns to normal consider follow-up with a snack or meal.  Informed provider of hypoglycemia episode at next appointment.    It is important that you exercise regularly at least 30 minutes 5 times a week as tolerated  Think about what you will eat, plan ahead. Choose " clean, green, fresh or frozen" over canned, processed or packaged foods which are more sugary, salty and fatty. 70 to 75% of food eaten should be vegetables and fruit. Three meals at set times with snacks allowed between meals, but they must be fruit or vegetables. Aim to eat over a 12 hour period , example 7 am to 7 pm, and STOP after  your last meal of the day. Drink water,generally about 64 ounces per day, no other drink is as healthy. Fruit juice is best enjoyed in a healthy way, by EATING the fruit.  Thanks for choosing [facility] we consider it a privelige to serve you.  Small cup of orange juice to bring your sugar all

## 2022-12-19 NOTE — Assessment & Plan Note (Signed)
Lab Results  Component Value Date   HGBA1C 7.8 (A) 11/24/2022   1. Uncontrolled type 2 diabetes mellitus with hyperglycemia (HCC)  - Ambulatory referral to diabetic education - insulin glargine (LANTUS SOLOSTAR) 100 UNIT/ML Solostar Pen; Inject 10 Units into the skin daily.  Dispense: 15 mL; Refill: PRN - Insulin Pen Needle 32G X 4 MM MISC; 1 each by Does not apply route daily.  Dispense: 100 each; Refill: 3  CBG goals discussed Signs of hypoglycemia discussed, encouraged to report blood sugar readings consistently less than 70 Follow-up in the office in 3 months    Stop taking glipizide stop crestor since she desires pregnancy

## 2022-12-19 NOTE — Progress Notes (Signed)
Established Patient Office Visit  Subjective:  Patient ID: Mariah Joyce, female    DOB: 1981-03-09  Age: 42 y.o. MRN: [id number]  CC:  Chief Complaint  Patient presents with   Consult    Switching med to be able to get pregnant. Crestor is medication in question     HPI Mariah Joyce is a 42 y.o. female  has a past medical history of Complication of anesthesia, GDM (gestational diabetes mellitus), Hyperlipidemia, and Type 2 diabetes mellitus (HCC).   Patient presents for follow-up for type 2 diabetes.     Type 2 diabetes .she is currently on glipizide 5 mg daily, Crestor 5 mg daily.  She intends getting pregnant and would like to switch to another diabetic medication. She has been checking her blood sugars at home reports readings between 120s to 140s, also doing more walking exercises and following a low-carb modified diet . She denies hypoglycemia.  Interpretation services provided via: [employer]        Past Medical History:  Diagnosis Date   Complication of anesthesia    GDM (gestational diabetes mellitus)    Hyperlipidemia    Type 2 diabetes mellitus (HCC)     Past Surgical History:  Procedure Laterality Date   CESAREAN SECTION     for non-reassuring fhr    Family History  Problem Relation Age of Onset   Diabetes Father    Asthma Neg Hx    Eczema Neg Hx    Environmental Allergies Neg Hx     Social History   Socioeconomic History   Marital status: Married    Spouse name: Not on file   Number of children: 4   Years of education: Not on file   Highest education level: Not on file  Occupational History   Not on file  Tobacco Use   Smoking status: Never   Smokeless tobacco: Never  Vaping Use   Vaping status: Never Used  Substance and Sexual Activity   Alcohol use: No   Drug use: No   Sexual activity: Not Currently    Birth control/protection: None  Other Topics Concern   Not on file  Social History Narrative    Lives with her husband and children    Social Determinants of Health   Financial Resource Strain: Not on file  Food Insecurity: No Food Insecurity (12/12/2022)   Hunger Vital Sign    Worried About Running Out of Food in the Last Year: Never true    Ran Out of Food in the Last Year: Never true  Transportation Needs: No Transportation Needs (12/12/2022)   PRAPARE - Administrator, Civil Service (Medical): No    Lack of Transportation (Non-Medical): No  Physical Activity: Not on file  Stress: Not on file  Social Connections: Not on file  Intimate Partner Violence: Not on file    Outpatient Medications Prior to Visit  Medication Sig Dispense Refill   Accu-Chek Softclix Lancets lancets Use as instructed to check blood sugars bid 200 each 12   Blood Glucose Monitoring Suppl (ACCU-CHEK GUIDE ME) w/Device KIT 1 each by Does not apply route in the morning and at bedtime. 1 kit 0   cetirizine (ZYRTEC) 10 MG tablet Take 1 tablet (10 mg total) by mouth 2 (two) times daily. 60 tablet 5   famotidine (PEPCID) 20 MG tablet Take 1 tablet (20 mg total) by mouth 2 (two) times daily. 60 tablet 5   glucose blood (ACCU-CHEK GUIDE) test  strip Use as instructed to check blood sugars bid 200 each 12   ibuprofen (ADVIL) 400 MG tablet Take 1 tablet (400 mg total) by mouth every 6 (six) hours as needed. 30 tablet 0   glipiZIDE (GLUCOTROL XL) 5 MG 24 hr tablet Take 1 tablet (5 mg total) by mouth daily with breakfast. 90 tablet 1   fluticasone (FLONASE) 50 MCG/ACT nasal spray fluticasone propionate 50 mcg/actuation nasal spray,suspension  SHAKE LQ AND U 2 SPRAYS IEN D (Patient not taking: Reported on 11/24/2022)     fluconazole (DIFLUCAN) 150 MG tablet Take 1 tablet (150 mg total) by mouth daily. Repeat in 24 hours if needed (Patient not taking: Reported on 12/19/2022) 2 tablet 2   rosuvastatin (CRESTOR) 5 MG tablet Take 1 tablet (5 mg total) by mouth daily. (Patient not taking: Reported on 12/19/2022) 90  tablet 0   No facility-administered medications prior to visit.    Allergies  Allergen Reactions   Other Itching and Rash    ALL NUTS   Peanut Allergen Powder-Dnfp Itching    ROS Review of Systems  Constitutional:  Negative for activity change, appetite change, chills, fatigue and fever.  HENT:  Negative for congestion, dental problem, ear discharge, ear pain, hearing loss, rhinorrhea, sinus pressure, sinus pain, sneezing and sore throat.   Eyes:  Negative for pain, discharge, redness and itching.  Respiratory:  Negative for cough, chest tightness, shortness of breath and wheezing.   Cardiovascular:  Negative for chest pain, palpitations and leg swelling.  Gastrointestinal:  Negative for abdominal distention, abdominal pain, anal bleeding, blood in stool, constipation, diarrhea, nausea, rectal pain and vomiting.  Endocrine: Negative for cold intolerance, heat intolerance, polydipsia, polyphagia and polyuria.  Genitourinary:  Negative for difficulty urinating, dysuria, flank pain, frequency, hematuria, menstrual problem, pelvic pain and vaginal bleeding.  Musculoskeletal:  Negative for arthralgias, back pain, gait problem, joint swelling and myalgias.  Skin:  Negative for color change, pallor, rash and wound.  Allergic/Immunologic: Negative for environmental allergies, food allergies and immunocompromised state.  Neurological:  Negative for dizziness, tremors, facial asymmetry, weakness and headaches.  Hematological:  Negative for adenopathy. Does not bruise/bleed easily.  Psychiatric/Behavioral:  Negative for agitation, behavioral problems, confusion, decreased concentration, hallucinations, self-injury and suicidal ideas.       Objective:    Physical Exam Vitals and nursing note reviewed.  Constitutional:      General: She is not in acute distress.    Appearance: Normal appearance. She is not ill-appearing, toxic-appearing or diaphoretic.  HENT:     Mouth/Throat:     Mouth:  Mucous membranes are moist.     Pharynx: Oropharynx is clear. No oropharyngeal exudate or posterior oropharyngeal erythema.  Eyes:     General: No scleral icterus.       Right eye: No discharge.        Left eye: No discharge.     Extraocular Movements: Extraocular movements intact.     Conjunctiva/sclera: Conjunctivae normal.  Cardiovascular:     Rate and Rhythm: Normal rate and regular rhythm.     Pulses: Normal pulses.     Heart sounds: Normal heart sounds. No murmur heard.    No friction rub. No gallop.  Pulmonary:     Effort: Pulmonary effort is normal. No respiratory distress.     Breath sounds: Normal breath sounds. No stridor. No wheezing, rhonchi or rales.  Chest:     Chest wall: No tenderness.  Abdominal:     General: There is no distension.  Palpations: Abdomen is soft.     Tenderness: There is no abdominal tenderness. There is no right CVA tenderness, left CVA tenderness or guarding.  Musculoskeletal:        General: No swelling, tenderness, deformity or signs of injury.     Right lower leg: No edema.     Left lower leg: No edema.  Skin:    General: Skin is warm and dry.     Capillary Refill: Capillary refill takes less than 2 seconds.     Coloration: Skin is not jaundiced or pale.     Findings: No bruising, erythema or lesion.  Neurological:     Mental Status: She is alert and oriented to person, place, and time.     Motor: No weakness.     Coordination: Coordination normal.     Gait: Gait normal.  Psychiatric:        Mood and Affect: Mood normal.        Behavior: Behavior normal.        Thought Content: Thought content normal.        Judgment: Judgment normal.     BP 111/72   Pulse 79   Temp (!) 97.2 F (36.2 C)   LMP 11/28/2022 (Exact Date)   SpO2 100%  Wt Readings from Last 3 Encounters:  12/12/22 165 lb (74.8 kg)  11/24/22 166 lb 9.6 oz (75.6 kg)  11/12/21 169 lb (76.7 kg)    Lab Results  Component Value Date   TSH 0.988 09/26/2017   Lab  Results  Component Value Date   WBC 4.9 10/30/2021   HGB 13.1 10/30/2021   HCT 38.2 10/30/2021   MCV 85.8 10/30/2021   PLT 196 10/30/2021   Lab Results  Component Value Date   NA 139 11/24/2022   K 4.2 11/24/2022   CO2 21 11/24/2022   GLUCOSE 138 (H) 11/24/2022   BUN 11 11/24/2022   CREATININE 0.66 11/24/2022   BILITOT 0.4 11/24/2022   ALKPHOS 57 11/24/2022   AST 14 11/24/2022   ALT 13 11/24/2022   PROT 6.7 11/24/2022   ALBUMIN 4.1 11/24/2022   CALCIUM 9.1 11/24/2022   ANIONGAP 8 06/16/2016   EGFR 114 11/24/2022   Lab Results  Component Value Date   CHOL 186 11/24/2022   Lab Results  Component Value Date   HDL 43 11/24/2022   Lab Results  Component Value Date   LDLCALC 110 (H) 11/24/2022   Lab Results  Component Value Date   TRIG 191 (H) 11/24/2022   Lab Results  Component Value Date   CHOLHDL 4.3 11/24/2022   Lab Results  Component Value Date   HGBA1C 7.8 (A) 11/24/2022      Assessment & Plan:   Problem List Items Addressed This Visit       Endocrine   Uncontrolled type 2 diabetes mellitus with hyperglycemia (HCC) - Primary    Lab Results  Component Value Date   HGBA1C 7.8 (A) 11/24/2022   1. Uncontrolled type 2 diabetes mellitus with hyperglycemia (HCC)  - Ambulatory referral to diabetic education - insulin glargine (LANTUS SOLOSTAR) 100 UNIT/ML Solostar Pen; Inject 10 Units into the skin daily.  Dispense: 15 mL; Refill: PRN - Insulin Pen Needle 32G X 4 MM MISC; 1 each by Does not apply route daily.  Dispense: 100 each; Refill: 3  CBG goals discussed Signs of hypoglycemia discussed, encouraged to report blood sugar readings consistently less than 70 Follow-up in the office in 3 months  Stop taking glipizide stop crestor since she desires pregnancy      Relevant Medications   insulin glargine (LANTUS SOLOSTAR) 100 UNIT/ML Solostar Pen   Insulin Pen Needle 32G X 4 MM MISC   Other Relevant Orders   Ambulatory referral to diabetic  education     Other   Patient desires pregnancy    Has type 2 diabetes but cannot take statin since she is actively trying to get pregnant. Glipizide switched to Lantus today       Meds ordered this encounter  Medications   DISCONTD: insulin glargine (LANTUS) 100 UNIT/ML injection    Sig: Inject 0.1 mLs (10 Units total) into the skin daily.    Dispense:  10 mL    Refill:  11   insulin glargine (LANTUS SOLOSTAR) 100 UNIT/ML Solostar Pen    Sig: Inject 10 Units into the skin daily.    Dispense:  15 mL    Refill:  PRN   Insulin Pen Needle 32G X 4 MM MISC    Sig: 1 each by Does not apply route daily.    Dispense:  100 each    Refill:  3    Follow-up: No follow-ups on file.    Donell Beers, FNP

## 2022-12-19 NOTE — Assessment & Plan Note (Signed)
Has type 2 diabetes but cannot take statin since she is actively trying to get pregnant. Glipizide switched to Lantus today

## 2022-12-27 ENCOUNTER — Encounter: Payer: Self-pay | Admitting: Family Medicine

## 2023-02-26 ENCOUNTER — Ambulatory Visit: Payer: Self-pay | Admitting: Nurse Practitioner

## 2023-02-27 ENCOUNTER — Encounter: Payer: Self-pay | Admitting: Nurse Practitioner

## 2023-02-27 ENCOUNTER — Ambulatory Visit (INDEPENDENT_AMBULATORY_CARE_PROVIDER_SITE_OTHER): Payer: BLUE CROSS/BLUE SHIELD | Admitting: Nurse Practitioner

## 2023-02-27 VITALS — BP 105/74 | HR 62 | Wt 159.8 lb

## 2023-02-27 DIAGNOSIS — N898 Other specified noninflammatory disorders of vagina: Secondary | ICD-10-CM | POA: Diagnosis not present

## 2023-02-27 DIAGNOSIS — N6310 Unspecified lump in the right breast, unspecified quadrant: Secondary | ICD-10-CM | POA: Diagnosis not present

## 2023-02-27 DIAGNOSIS — E1165 Type 2 diabetes mellitus with hyperglycemia: Secondary | ICD-10-CM | POA: Diagnosis not present

## 2023-02-27 DIAGNOSIS — N979 Female infertility, unspecified: Secondary | ICD-10-CM | POA: Insufficient documentation

## 2023-02-27 DIAGNOSIS — Z319 Encounter for procreative management, unspecified: Secondary | ICD-10-CM | POA: Diagnosis not present

## 2023-02-27 HISTORY — DX: Unspecified lump in the right breast, unspecified quadrant: N63.10

## 2023-02-27 HISTORY — DX: Female infertility, unspecified: N97.9

## 2023-02-27 LAB — POCT GLYCOSYLATED HEMOGLOBIN (HGB A1C): HbA1c, POC (controlled diabetic range): 6.7 % (ref 0.0–7.0)

## 2023-02-27 NOTE — Assessment & Plan Note (Signed)
Lab Results  Component Value Date   HGBA1C 6.7 02/27/2023  Well-controlled on Lantus 10 units daily Continue current medication Patient counseled on low-carb modified diet Not on a statin because she is actively trying to conceive Not on ACE or ARB but her blood pressure is well-controlled Follow-up in 4 months

## 2023-02-27 NOTE — Progress Notes (Signed)
Established Patient Office Visit  Subjective:  Patient ID: Mariah Joyce, female    DOB: 23-Jul-1980  Age: 42 y.o. MRN: [id number]  CC:  Chief Complaint  Patient presents with   Diabetes    HPI Mariah Joyce is a 42 y.o. female  has a past medical history of Complication of anesthesia, GDM (gestational diabetes mellitus), Hyperlipidemia, and Type 2 diabetes mellitus (HCC).  Patient presents for follow-up for type 2 diabetes  Type 2 diabetes.  Currently on Lantus 10 units daily.  Stated that she has been following a low-carb diet does walking exercises 30 minutes daily.  Patient reports fasting blood sugar readings of 100-130's.  No complaints of polyphagia polyuria polydipsia.  Patient desires pregnancy.  She has been actively trying to conceive for the past 2 years.  She reports regular menses, has 3 boys and 1 girl.  She was evaluated by the gynecologist in August, states that she would like to see another gynecologist because she did not understand anything needed for her.  Patient complains of vaginal itching with white-colored discharge.  She tested positive for Candida vaginal infection was treated with fluconazole 150 mg x 2.  Stated that her symptoms did not resolve.  She has been using an OTC vaginal suppository that helps.  No fever, chills, abdominal pain dysuria,  Patient declined flu vaccine today.  She was encouraged to consider getting the vaccine.  Interpretation services provided via [facility] health iPad.  Past Medical History:  Diagnosis Date   Complication of anesthesia    GDM (gestational diabetes mellitus)    Hyperlipidemia    Type 2 diabetes mellitus (HCC)     Past Surgical History:  Procedure Laterality Date   CESAREAN SECTION     for non-reassuring fhr    Family History  Problem Relation Age of Onset   Diabetes Father    Asthma Neg Hx    Eczema Neg Hx    Environmental Allergies Neg Hx     Social History   Socioeconomic  History   Marital status: Married    Spouse name: Not on file   Number of children: 4   Years of education: Not on file   Highest education level: Not on file  Occupational History   Not on file  Tobacco Use   Smoking status: Never   Smokeless tobacco: Never  Vaping Use   Vaping status: Never Used  Substance and Sexual Activity   Alcohol use: No   Drug use: No   Sexual activity: Not Currently    Birth control/protection: None  Other Topics Concern   Not on file  Social History Narrative   Lives with her husband and children    Social Determinants of Health   Financial Resource Strain: Not on file  Food Insecurity: No Food Insecurity (12/12/2022)   Hunger Vital Sign    Worried About Running Out of Food in the Last Year: Never true    Ran Out of Food in the Last Year: Never true  Transportation Needs: No Transportation Needs (12/12/2022)   PRAPARE - Administrator, Civil Service (Medical): No    Lack of Transportation (Non-Medical): No  Physical Activity: Not on file  Stress: Not on file  Social Connections: Not on file  Intimate Partner Violence: Not on file    Outpatient Medications Prior to Visit  Medication Sig Dispense Refill   Accu-Chek Softclix Lancets lancets Use as instructed to check blood sugars bid 200 each 12  Blood Glucose Monitoring Suppl (ACCU-CHEK GUIDE ME) w/Device KIT 1 each by Does not apply route in the morning and at bedtime. 1 kit 0   cetirizine (ZYRTEC) 10 MG tablet Take 1 tablet (10 mg total) by mouth 2 (two) times daily. 60 tablet 5   famotidine (PEPCID) 20 MG tablet Take 1 tablet (20 mg total) by mouth 2 (two) times daily. 60 tablet 5   fluticasone (FLONASE) 50 MCG/ACT nasal spray      glucose blood (ACCU-CHEK GUIDE) test strip Use as instructed to check blood sugars bid 200 each 12   ibuprofen (ADVIL) 400 MG tablet Take 1 tablet (400 mg total) by mouth every 6 (six) hours as needed. 30 tablet 0   insulin glargine (LANTUS SOLOSTAR)  100 UNIT/ML Solostar Pen Inject 10 Units into the skin daily. 15 mL PRN   Insulin Pen Needle 32G X 4 MM MISC 1 each by Does not apply route daily. 100 each 3   No facility-administered medications prior to visit.    Allergies  Allergen Reactions   Other Itching and Rash    ALL NUTS   Peanut Allergen Powder-Dnfp Itching    ROS Review of Systems  Constitutional:  Negative for appetite change, chills, fatigue and fever.  HENT:  Negative for congestion, postnasal drip, rhinorrhea and sneezing.   Respiratory:  Negative for cough, shortness of breath and wheezing.   Cardiovascular:  Negative for chest pain, palpitations and leg swelling.  Gastrointestinal:  Negative for abdominal pain, constipation, nausea and vomiting.  Genitourinary:  Negative for difficulty urinating, dysuria, flank pain and frequency.  Musculoskeletal:  Negative for arthralgias, back pain, joint swelling and myalgias.  Skin:  Negative for color change, pallor, rash and wound.  Neurological:  Negative for dizziness, facial asymmetry, weakness, numbness and headaches.  Psychiatric/Behavioral:  Negative for behavioral problems, confusion, self-injury and suicidal ideas.       Objective:    Physical Exam Vitals and nursing note reviewed.  Constitutional:      General: She is not in acute distress.    Appearance: Normal appearance. She is obese. She is not ill-appearing, toxic-appearing or diaphoretic.  HENT:     Mouth/Throat:     Mouth: Mucous membranes are moist.     Pharynx: Oropharynx is clear. No oropharyngeal exudate or posterior oropharyngeal erythema.  Eyes:     General: No scleral icterus.       Right eye: No discharge.        Left eye: No discharge.     Extraocular Movements: Extraocular movements intact.     Conjunctiva/sclera: Conjunctivae normal.  Cardiovascular:     Rate and Rhythm: Normal rate and regular rhythm.     Pulses: Normal pulses.     Heart sounds: Normal heart sounds. No murmur  heard.    No friction rub. No gallop.  Pulmonary:     Effort: Pulmonary effort is normal. No respiratory distress.     Breath sounds: Normal breath sounds. No stridor. No wheezing, rhonchi or rales.  Chest:     Chest wall: No tenderness.  Abdominal:     General: There is no distension.     Palpations: Abdomen is soft.     Tenderness: There is no abdominal tenderness. There is no right CVA tenderness, left CVA tenderness or guarding.  Musculoskeletal:        General: No swelling, tenderness, deformity or signs of injury.     Right lower leg: No edema.     Left lower  leg: No edema.  Skin:    General: Skin is warm and dry.     Capillary Refill: Capillary refill takes less than 2 seconds.     Coloration: Skin is not jaundiced or pale.     Findings: No bruising, erythema or lesion.  Neurological:     Mental Status: She is alert and oriented to person, place, and time.     Motor: No weakness.     Coordination: Coordination normal.     Gait: Gait normal.  Psychiatric:        Mood and Affect: Mood normal.        Behavior: Behavior normal.        Thought Content: Thought content normal.        Judgment: Judgment normal.     BP 105/74 (BP Location: Right Arm, Patient Position: Sitting, Cuff Size: Normal)   Pulse 62   Wt 159 lb 12.8 oz (72.5 kg)   SpO2 100%   BMI 30.19 kg/m  Wt Readings from Last 3 Encounters:  02/27/23 159 lb 12.8 oz (72.5 kg)  12/12/22 165 lb (74.8 kg)  11/24/22 166 lb 9.6 oz (75.6 kg)    Lab Results  Component Value Date   TSH 0.988 09/26/2017   Lab Results  Component Value Date   WBC 4.9 10/30/2021   HGB 13.1 10/30/2021   HCT 38.2 10/30/2021   MCV 85.8 10/30/2021   PLT 196 10/30/2021   Lab Results  Component Value Date   NA 139 11/24/2022   K 4.2 11/24/2022   CO2 21 11/24/2022   GLUCOSE 138 (H) 11/24/2022   BUN 11 11/24/2022   CREATININE 0.66 11/24/2022   BILITOT 0.4 11/24/2022   ALKPHOS 57 11/24/2022   AST 14 11/24/2022   ALT 13  11/24/2022   PROT 6.7 11/24/2022   ALBUMIN 4.1 11/24/2022   CALCIUM 9.1 11/24/2022   ANIONGAP 8 06/16/2016   EGFR 114 11/24/2022   Lab Results  Component Value Date   CHOL 186 11/24/2022   Lab Results  Component Value Date   HDL 43 11/24/2022   Lab Results  Component Value Date   LDLCALC 110 (H) 11/24/2022   Lab Results  Component Value Date   TRIG 191 (H) 11/24/2022   Lab Results  Component Value Date   CHOLHDL 4.3 11/24/2022   Lab Results  Component Value Date   HGBA1C 6.7 02/27/2023      Assessment & Plan:   Problem List Items Addressed This Visit       Endocrine   Uncontrolled type 2 diabetes mellitus with hyperglycemia (HCC) - Primary    Lab Results  Component Value Date   HGBA1C 6.7 02/27/2023  Well-controlled on Lantus 10 units daily Continue current medication Patient counseled on low-carb modified diet Not on a statin because she is actively trying to conceive Not on ACE or ARB but her blood pressure is well-controlled Follow-up in 4 months      Relevant Orders   POCT glycosylated hemoglobin (Hb A1C) (Completed)     Genitourinary   Vaginal pruritus    Has had recurrent yeast vaginal infection We will obtain labs below.  I am suspecting that she might be resistance to fluconazole.  If resistant to fluconazole will treat with nystatin 100,000 units suppository nightly for 14 days. - NuSwab Vaginitis Plus (VG+) - Antifungal Suscep, Fluconazole       Relevant Orders   NuSwab Vaginitis Plus (VG+)   Antifungal Suscep, Fluconazole  Other   Patient desires pregnancy    Patient has been actively trying to conceive since the past 2 years without success She would like to be evaluated by another gynecologist Referral placed today      Relevant Orders   Ambulatory referral to Gynecology   Mass of right breast    Needs follow-up mammogram done for her recent abnormal mammogram She prefers to have test done at [facility] Patient referred to  the [facility] imaging center    Imaging Results - (ABNORMAL) MG Breast Screening Tomo Bilat (01/15/2023 9:16 AM EDT) Procedure Note  Johnney Killian, MD - 01/16/2023  Formatting of this note might be different from the original. CLINICAL DATA: Screening.  EXAM: DIGITAL SCREENING BILATERAL MAMMOGRAM WITH TOMOSYNTHESIS AND CAD  TECHNIQUE: Bilateral screening digital craniocaudal and mediolateral oblique mammograms were obtained. Bilateral screening digital breast tomosynthesis was performed. The images were evaluated with computer-aided detection.  COMPARISON: None available.  ACR Breast Density Category b: There are scattered areas of fibroglandular density.  FINDINGS: In the right breast, a possible mass warrants further evaluation. In the left breast, no findings suspicious for malignancy.  IMPRESSION: Further evaluation is suggested for a possible mass in the right breast.  RECOMMENDATION: Diagnostic mammogram and possibly ultrasound of the right breast. (Code:[id number]-73M)  The patient will be contacted regarding the findings, and additional imaging will be scheduled.  BI-RADS CATEGORY 0: Incomplete: Need additional imaging evaluation.   01/15/2023        Relevant Orders   MM 3D DIAGNOSTIC MAMMOGRAM UNILATERAL RIGHT BREAST    No orders of the defined types were placed in this encounter.   Follow-up: Return in about 4 months (around 06/27/2023) for DM.    Donell Beers, FNP

## 2023-02-27 NOTE — Assessment & Plan Note (Signed)
Needs follow-up mammogram done for her recent abnormal mammogram She prefers to have test done at [facility] Patient referred to the [facility] imaging center    Imaging Results - (ABNORMAL) MG Breast Screening Tomo Bilat (01/15/2023 9:16 AM EDT) Procedure Note  Johnney Killian, MD - 01/16/2023  Formatting of this note might be different from the original. CLINICAL DATA: Screening.  EXAM: DIGITAL SCREENING BILATERAL MAMMOGRAM WITH TOMOSYNTHESIS AND CAD  TECHNIQUE: Bilateral screening digital craniocaudal and mediolateral oblique mammograms were obtained. Bilateral screening digital breast tomosynthesis was performed. The images were evaluated with computer-aided detection.  COMPARISON: None available.  ACR Breast Density Category b: There are scattered areas of fibroglandular density.  FINDINGS: In the right breast, a possible mass warrants further evaluation. In the left breast, no findings suspicious for malignancy.  IMPRESSION: Further evaluation is suggested for a possible mass in the right breast.  RECOMMENDATION: Diagnostic mammogram and possibly ultrasound of the right breast. (Code:[id number])  The patient will be contacted regarding the findings, and additional imaging will be scheduled.  BI-RADS CATEGORY 0: Incomplete: Need additional imaging evaluation.   01/15/2023

## 2023-02-27 NOTE — Assessment & Plan Note (Signed)
Has had recurrent yeast vaginal infection We will obtain labs below.  I am suspecting that she might be resistance to fluconazole.  If resistant to fluconazole will treat with nystatin 100,000 units suppository nightly for 14 days. - NuSwab Vaginitis Plus (VG+) - Antifungal Suscep, Fluconazole

## 2023-02-27 NOTE — Patient Instructions (Signed)
Please call [facility] on [phone] to schedule an appointment for your mammogram  Please consider getting your influenza vaccine   Goal for fasting blood sugar ranges from 80 to 120 and 2 hours after any meal or at bedtime should be between 130 to 170.   1. Uncontrolled type 2 diabetes mellitus with hyperglycemia (HCC)  - POCT glycosylated hemoglobin (Hb A1C)  2. Vaginal pruritus  - NuSwab Vaginitis Plus (VG+)  3. Mass of right breast, unspecified quadrant  - MM 3D DIAGNOSTIC MAMMOGRAM UNILATERAL RIGHT BREAST  4. Infertility, female  - Ambulatory referral to Gynecology    It is important that you exercise regularly at least 30 minutes 5 times a week as tolerated  Think about what you will eat, plan ahead. Choose " clean, green, fresh or frozen" over canned, processed or packaged foods which are more sugary, salty and fatty. 70 to 75% of food eaten should be vegetables and fruit. Three meals at set times with snacks allowed between meals, but they must be fruit or vegetables. Aim to eat over a 12 hour period , example 7 am to 7 pm, and STOP after  your last meal of the day. Drink water,generally about 64 ounces per day, no other drink is as healthy. Fruit juice is best enjoyed in a healthy way, by EATING the fruit.  Thanks for choosing [facility] we consider it a privelige to serve you.

## 2023-02-27 NOTE — Progress Notes (Signed)
Michael Boston #[id number]

## 2023-02-27 NOTE — Assessment & Plan Note (Addendum)
Patient has been actively trying to conceive since the past 2 years without success She would like to be evaluated by another gynecologist Referral placed today

## 2023-03-02 LAB — NUSWAB VAGINITIS PLUS (VG+)
Candida albicans, NAA: NEGATIVE
Candida glabrata, NAA: NEGATIVE
Chlamydia trachomatis, NAA: NEGATIVE
Neisseria gonorrhoeae, NAA: NEGATIVE
Trich vag by NAA: NEGATIVE

## 2023-03-10 ENCOUNTER — Other Ambulatory Visit: Payer: Self-pay | Admitting: Nurse Practitioner

## 2023-03-10 DIAGNOSIS — E785 Hyperlipidemia, unspecified: Secondary | ICD-10-CM

## 2023-03-12 ENCOUNTER — Other Ambulatory Visit: Payer: Self-pay | Admitting: Nurse Practitioner

## 2023-03-12 DIAGNOSIS — E1165 Type 2 diabetes mellitus with hyperglycemia: Secondary | ICD-10-CM

## 2023-03-12 MED ORDER — LANTUS SOLOSTAR 100 UNIT/ML ~~LOC~~ SOPN: 10.0000 [IU] | PEN_INJECTOR | Freq: Every day | SUBCUTANEOUS | 3 refills | Status: DC

## 2023-03-12 NOTE — Telephone Encounter (Signed)
Please advise K[facility] 

## 2023-03-13 ENCOUNTER — Other Ambulatory Visit: Payer: Self-pay

## 2023-03-13 ENCOUNTER — Other Ambulatory Visit: Payer: Self-pay | Admitting: Nurse Practitioner

## 2023-03-13 DIAGNOSIS — E1165 Type 2 diabetes mellitus with hyperglycemia: Secondary | ICD-10-CM

## 2023-03-13 DIAGNOSIS — N6310 Unspecified lump in the right breast, unspecified quadrant: Secondary | ICD-10-CM

## 2023-03-13 MED ORDER — INSULIN GLARGINE-YFGN 100 UNIT/ML ~~LOC~~ SOPN
10.0000 [IU] | PEN_INJECTOR | Freq: Every day | SUBCUTANEOUS | 3 refills | Status: DC
Start: 2023-03-13 — End: 2023-05-16

## 2023-03-21 ENCOUNTER — Ambulatory Visit: Payer: BLUE CROSS/BLUE SHIELD | Admitting: Dietician

## 2023-05-10 ENCOUNTER — Ambulatory Visit: Payer: BLUE CROSS/BLUE SHIELD | Admitting: Dietician

## 2023-05-11 ENCOUNTER — Other Ambulatory Visit: Payer: Self-pay

## 2023-05-14 ENCOUNTER — Other Ambulatory Visit: Payer: Self-pay

## 2023-05-16 ENCOUNTER — Other Ambulatory Visit: Payer: Self-pay

## 2023-05-16 ENCOUNTER — Other Ambulatory Visit: Payer: Self-pay | Admitting: Nurse Practitioner

## 2023-05-16 DIAGNOSIS — E1165 Type 2 diabetes mellitus with hyperglycemia: Secondary | ICD-10-CM

## 2023-05-16 MED ORDER — INSULIN GLARGINE 100 UNIT/ML SOLOSTAR PEN
10.0000 [IU] | PEN_INJECTOR | Freq: Every day | SUBCUTANEOUS | 1 refills | Status: DC
Start: 2023-05-16 — End: 2023-06-26

## 2023-05-17 ENCOUNTER — Telehealth: Payer: Self-pay

## 2023-05-17 NOTE — Telephone Encounter (Signed)
Pt was called to advise of the change of her insulin. Per her insurance it had to be changed to Lantus. Pt will take 10 Ut daily . LVM [facility]

## 2023-06-09 ENCOUNTER — Other Ambulatory Visit: Payer: Self-pay | Admitting: Nurse Practitioner

## 2023-06-09 DIAGNOSIS — E1165 Type 2 diabetes mellitus with hyperglycemia: Secondary | ICD-10-CM

## 2023-06-21 ENCOUNTER — Ambulatory Visit: Payer: BLUE CROSS/BLUE SHIELD | Admitting: Dietician

## 2023-06-25 ENCOUNTER — Ambulatory Visit: Payer: Self-pay | Admitting: Nurse Practitioner

## 2023-06-26 ENCOUNTER — Encounter: Payer: Self-pay | Admitting: Skilled Nursing Facility1

## 2023-06-26 ENCOUNTER — Ambulatory Visit: Payer: Self-pay | Admitting: Nurse Practitioner

## 2023-06-26 ENCOUNTER — Encounter: Payer: Self-pay | Admitting: Nurse Practitioner

## 2023-06-26 ENCOUNTER — Encounter: Payer: BLUE CROSS/BLUE SHIELD | Attending: Nurse Practitioner | Admitting: Skilled Nursing Facility1

## 2023-06-26 VITALS — BP 107/68 | HR 65 | Temp 98.0°F | Wt 154.8 lb

## 2023-06-26 DIAGNOSIS — E785 Hyperlipidemia, unspecified: Secondary | ICD-10-CM | POA: Diagnosis not present

## 2023-06-26 DIAGNOSIS — J309 Allergic rhinitis, unspecified: Secondary | ICD-10-CM | POA: Diagnosis not present

## 2023-06-26 DIAGNOSIS — E119 Type 2 diabetes mellitus without complications: Secondary | ICD-10-CM | POA: Insufficient documentation

## 2023-06-26 DIAGNOSIS — E1165 Type 2 diabetes mellitus with hyperglycemia: Secondary | ICD-10-CM

## 2023-06-26 DIAGNOSIS — Z1321 Encounter for screening for nutritional disorder: Secondary | ICD-10-CM | POA: Diagnosis not present

## 2023-06-26 HISTORY — DX: Hyperlipidemia, unspecified: E78.5

## 2023-06-26 HISTORY — DX: Allergic rhinitis, unspecified: J30.9

## 2023-06-26 LAB — POCT GLYCOSYLATED HEMOGLOBIN (HGB A1C): Hemoglobin A1C: 6.7 % — AB (ref 4.0–5.6)

## 2023-06-26 MED ORDER — GLIPIZIDE ER 5 MG PO TB24: 5.0000 mg | ORAL_TABLET | Freq: Every day | ORAL | 1 refills | Status: DC

## 2023-06-26 NOTE — Progress Notes (Signed)
 Interpretor: Donnetta Hail with [facility]  DM medications: Glipizide   Other diagnoses:  Hyperlipidemia  GDM  A1C 6.7  Pt states she is practicing Ramadan. Pt states she wakes at 5am and eats after checking her blood sugars: fasting 115-120. Pt states she checks her blood sugars in the day to ensure no low blood sugars are occurring.    Diabetes Self-Management Education  Visit Type: First/Initial  Appt. Start Time: 4:04 Appt. End Time: 4:35  06/26/2023  [name], identified by name and date of birth, is a 43 y.o. female with a diagnosis of Diabetes: Type 2.   ASSESSMENT  There were no vitals taken for this visit. There is no height or weight on file to calculate BMI.   Diabetes Self-Management Education - 06/26/23 1609       Visit Information   Visit Type First/Initial      Initial Visit   Diabetes Type Type 2    Are you currently following a meal plan? No    Are you taking your medications as prescribed? Yes      Health Coping   How would you rate your overall health? Good      Psychosocial Assessment   Patient Belief/Attitude about Diabetes Motivated to manage diabetes    What is the hardest part about your diabetes right now, causing you the most concern, or is the most worrisome to you about your diabetes?   Making healty food and beverage choices    Self-care barriers None    Self-management support Friends;Family    Other persons present Interpreter    Patient Concerns Nutrition/Meal planning;Glycemic Control    Special Needs None    Preferred Learning Style Auditory;Visual    Learning Readiness Contemplating    How often do you need to have someone help you when you read instructions, pamphlets, or other written materials from your doctor or pharmacy? 5 - Always      Pre-Education Assessment   Patient understands the diabetes disease and treatment process. Needs Instruction    Patient understands incorporating nutritional management into lifestyle.  Needs Instruction    Patient undertands incorporating physical activity into lifestyle. Needs Instruction    Patient understands using medications safely. Needs Instruction    Patient understands monitoring blood glucose, interpreting and using results Needs Instruction    Patient understands prevention, detection, and treatment of acute complications. Needs Instruction    Patient understands prevention, detection, and treatment of chronic complications. Needs Instruction    Patient understands how to develop strategies to address psychosocial issues. Needs Instruction    Patient understands how to develop strategies to promote health/change behavior. Needs Instruction      Complications   Last HgB A1C per patient/outside source 6.7 %    How often do you check your blood sugar? 1-2 times/day    Have you had a dilated eye exam in the past 12 months? Yes    Have you had a dental exam in the past 12 months? Yes    Are you checking your feet? No      Dietary Intake   Breakfast milk + oatmeal + 1 egg    Dinner 6:30pm egg + fava bean + salad + cheese + chicken + bread    Snack (evening) 10pm: 1 egg    Beverage(s) water, baobab seed drink, orange      Activity / Exercise   Activity / Exercise Type ADL's;Light (walking / raking leaves)    How many days per week  do you exercise? 4    How many minutes per day do you exercise? 30    Total minutes per week of exercise 120      Patient Education   Previous Diabetes Education Yes (please comment)    Disease Pathophysiology Definition of diabetes, type 1 and 2, and the diagnosis of diabetes;Factors that contribute to the development of diabetes    Healthy Eating Role of diet in the treatment of diabetes and the relationship between the three main macronutrients and blood glucose level;Plate Method;Carbohydrate counting;Food label reading, portion sizes and measuring food.    Being Active Identified with patient nutritional and/or medication changes  necessary with exercise.    Medications Reviewed patients medication for diabetes, action, purpose, timing of dose and side effects.    Monitoring Taught/evaluated SMBG meter.;Daily foot exams;Yearly dilated eye exam    Acute complications Taught prevention, symptoms, and  treatment of hypoglycemia - the 15 rule.;Discussed and identified patients' prevention, symptoms, and treatment of hyperglycemia.    Chronic complications Dental care;Retinopathy and reason for yearly dilated eye exams;Nephropathy, what it is, prevention of, the use of ACE, ARB's and early detection of through urine microalbumia.;Lipid levels, blood glucose control and heart disease;Relationship between chronic complications and blood glucose control    Diabetes Stress and Support Role of stress on diabetes      Individualized Goals (developed by patient)   Nutrition Follow meal plan discussed;General guidelines for healthy choices and portions discussed    Physical Activity Exercise 5-7 days per week;30 minutes per day;60 minutes per day    Medications take my medication as prescribed    Monitoring  Test my blood glucose as discussed;Test blood glucose pre and post meals as discussed    Problem Solving Eating Pattern      Post-Education Assessment   Patient understands the diabetes disease and treatment process. Demonstrates understanding / competency    Patient understands incorporating nutritional management into lifestyle. Demonstrates understanding / competency    Patient undertands incorporating physical activity into lifestyle. Demonstrates understanding / competency    Patient understands using medications safely. Demonstrates understanding / competency    Patient understands monitoring blood glucose, interpreting and using results Demonstrates understanding / competency    Patient understands prevention, detection, and treatment of acute complications. Demonstrates understanding / competency    Patient understands  prevention, detection, and treatment of chronic complications. Demonstrates understanding / competency    Patient understands how to develop strategies to address psychosocial issues. Demonstrates understanding / competency    Patient understands how to develop strategies to promote health/change behavior. Demonstrates understanding / competency      Outcomes   Expected Outcomes Demonstrated interest in learning. Expect positive outcomes    Future DMSE PRN    Program Status Completed             Individualized Plan for Diabetes Self-Management Training:   Learning Objective:  Patient will have a greater understanding of diabetes self-management. Patient education plan is to attend individual and/or group sessions per assessed needs and concerns.    Expected Outcomes:  Demonstrated interest in learning. Expect positive outcomes  Education material provided: ADA - How to Thrive: A Guide for Your Journey with Diabetes, Food label handouts, and My Plate In Arabic  If problems or questions, patient to contact team via:  Phone and Email  Future DSME appointment: PRN

## 2023-06-26 NOTE — Patient Instructions (Signed)
 Please consider getting pneumococcal and influenza vaccine at local pharmacy.  Goal for fasting blood sugar ranges from 80 to 120 and 2 hours after any meal or at bedtime should be between 130 to 170.      It is important that you exercise regularly at least 30 minutes 5 times a week as tolerated  Think about what you will eat, plan ahead. Choose " clean, green, fresh or frozen" over canned, processed or packaged foods which are more sugary, salty and fatty. 70 to 75% of food eaten should be vegetables and fruit. Three meals at set times with snacks allowed between meals, but they must be fruit or vegetables. Aim to eat over a 12 hour period , example 7 am to 7 pm, and STOP after  your last meal of the day. Drink water,generally about 64 ounces per day, no other drink is as healthy. Fruit juice is best enjoyed in a healthy way, by EATING the fruit.  Thanks for choosing [facility] we consider it a privelige to serve you.

## 2023-06-26 NOTE — Assessment & Plan Note (Addendum)
 Lab Results  Component Value Date   CHOL 186 11/24/2022   HDL 43 11/24/2022   LDLCALC 110 (H) 11/24/2022   TRIG 191 (H) 11/24/2022   CHOLHDL 4.3 11/24/2022  LDL goal is less than 70, patient declined starting medication for hyperlipidemia, need for medication to reduce the risk of heart attack, and I discussed Avoid fatty fried foods

## 2023-06-26 NOTE — Assessment & Plan Note (Signed)
 Currently well-controlled Continue Zyrtec and Flonase nasal spray

## 2023-06-26 NOTE — Assessment & Plan Note (Signed)
 Lab Results  Component Value Date   HGBA1C 6.7 (A) 06/26/2023  Chronic medical condition currently well-controlled on glipizide 5 mg daily Continue current medication Patient counseled on low-carb diet, engage in regular moderate exercise at least 150 minutes weekly as tolerated Encouraged to get her diabetes eye exam completed Follow-up in 4 months

## 2023-06-26 NOTE — Assessment & Plan Note (Signed)
 Patient requested for vitamin D screening Checking labs

## 2023-06-26 NOTE — Progress Notes (Signed)
 Established Patient Office Visit  Subjective:  Patient ID: Mariah Joyce, female    DOB: Sep 08, 1980  Age: 43 y.o. MRN: [id number]  CC:  Chief Complaint  Patient presents with   Medical Management of Chronic Issues    HPI Mariah Joyce is a 43 y.o. female  has a past medical history of Complication of anesthesia, GDM (gestational diabetes mellitus), Hyperlipidemia, and Type 2 diabetes mellitus (HCC).  Patient presents for follow-up for her chronic medical conditions    Controlled type 2 diabetes.  Currently on glipizide 5 mg daily, started glipizide about a month ago and stopped insulin.  States that she is not actively trying to get pregnant.  Fasting blood sugar has been between 120s to 130s, 2 hours after eating has always been less than 170.  She is not interested in starting medication for hyperlipidemia.  She has received a call from the ophthalmologist to get her diabetic eye exam done, she plans to schedule an appointment with them. patient denies polyuria polyphagia polydipsia    Due for pneumococcal vaccine and flu vaccine patient declined both vaccines today  Interpretation services provided via [facility] iPad     Past Medical History:  Diagnosis Date   Complication of anesthesia    GDM (gestational diabetes mellitus)    Hyperlipidemia    Type 2 diabetes mellitus (HCC)     Past Surgical History:  Procedure Laterality Date   CESAREAN SECTION     for non-reassuring fhr    Family History  Problem Relation Age of Onset   Diabetes Father    Asthma Neg Hx    Eczema Neg Hx    Environmental Allergies Neg Hx     Social History   Socioeconomic History   Marital status: Married    Spouse name: Not on file   Number of children: 4   Years of education: Not on file   Highest education level: Not on file  Occupational History   Not on file  Tobacco Use   Smoking status: Never   Smokeless tobacco: Never  Vaping Use   Vaping  status: Never Used  Substance and Sexual Activity   Alcohol use: No   Drug use: No   Sexual activity: Not Currently    Birth control/protection: None  Other Topics Concern   Not on file  Social History Narrative   Lives with her husband and children    Social Drivers of Corporate investment banker Strain: Not on file  Food Insecurity: No Food Insecurity (12/12/2022)   Hunger Vital Sign    Worried About Running Out of Food in the Last Year: Never true    Ran Out of Food in the Last Year: Never true  Transportation Needs: No Transportation Needs (12/12/2022)   PRAPARE - Administrator, Civil Service (Medical): No    Lack of Transportation (Non-Medical): No  Physical Activity: Not on file  Stress: Not on file  Social Connections: Not on file  Intimate Partner Violence: Not on file    Outpatient Medications Prior to Visit  Medication Sig Dispense Refill   Accu-Chek Softclix Lancets lancets Use as instructed to check blood sugars bid 200 each 12   Blood Glucose Monitoring Suppl (ACCU-CHEK GUIDE ME) w/Device KIT 1 each by Does not apply route in the morning and at bedtime. 1 kit 0   cetirizine (ZYRTEC) 10 MG tablet Take 1 tablet (10 mg total) by mouth 2 (two) times daily. 60 tablet 5  fluticasone (FLONASE) 50 MCG/ACT nasal spray      glucose blood (ACCU-CHEK GUIDE) test strip Use as instructed to check blood sugars bid 200 each 12   ibuprofen (ADVIL) 400 MG tablet Take 1 tablet (400 mg total) by mouth every 6 (six) hours as needed. 30 tablet 0   glipiZIDE (GLUCOTROL XL) 5 MG 24 hr tablet Take 5 mg by mouth daily.     famotidine (PEPCID) 20 MG tablet Take 1 tablet (20 mg total) by mouth 2 (two) times daily. (Patient not taking: Reported on 06/26/2023) 60 tablet 5   Insulin Pen Needle 32G X 4 MM MISC 1 each by Does not apply route daily. (Patient not taking: Reported on 06/26/2023) 100 each 3   insulin glargine (LANTUS) 100 UNIT/ML Solostar Pen Inject 10 Units into the skin  daily. (Patient not taking: Reported on 06/26/2023) 15 mL 1   No facility-administered medications prior to visit.    Allergies  Allergen Reactions   Other Itching and Rash    ALL NUTS   Peanut Allergen Powder-Dnfp Itching    ROS Review of Systems  Constitutional:  Negative for appetite change, chills, fatigue and fever.  HENT:  Negative for congestion, postnasal drip, rhinorrhea and sneezing.   Respiratory:  Negative for cough, shortness of breath and wheezing.   Cardiovascular:  Negative for chest pain, palpitations and leg swelling.  Gastrointestinal:  Negative for abdominal pain, constipation, nausea and vomiting.  Genitourinary:  Negative for difficulty urinating, dysuria, flank pain and frequency.  Musculoskeletal:  Negative for arthralgias, back pain, joint swelling and myalgias.  Skin:  Negative for color change, pallor, rash and wound.  Neurological:  Negative for dizziness, facial asymmetry, weakness, numbness and headaches.  Psychiatric/Behavioral:  Negative for behavioral problems, confusion, self-injury and suicidal ideas.       Objective:    Physical Exam Vitals and nursing note reviewed.  Constitutional:      General: She is not in acute distress.    Appearance: Normal appearance. She is not ill-appearing, toxic-appearing or diaphoretic.  HENT:     Mouth/Throat:     Mouth: Mucous membranes are moist.     Pharynx: Oropharynx is clear. No oropharyngeal exudate or posterior oropharyngeal erythema.  Eyes:     General: No scleral icterus.       Right eye: No discharge.        Left eye: No discharge.     Extraocular Movements: Extraocular movements intact.     Conjunctiva/sclera: Conjunctivae normal.  Cardiovascular:     Rate and Rhythm: Normal rate and regular rhythm.     Pulses: Normal pulses.     Heart sounds: Normal heart sounds. No murmur heard.    No friction rub. No gallop.  Pulmonary:     Effort: Pulmonary effort is normal. No respiratory distress.      Breath sounds: Normal breath sounds. No stridor. No wheezing, rhonchi or rales.  Chest:     Chest wall: No tenderness.  Abdominal:     General: There is no distension.     Palpations: Abdomen is soft.     Tenderness: There is no abdominal tenderness. There is no right CVA tenderness, left CVA tenderness or guarding.  Musculoskeletal:        General: No swelling, tenderness, deformity or signs of injury.     Right lower leg: No edema.     Left lower leg: No edema.  Skin:    General: Skin is warm and dry.  Capillary Refill: Capillary refill takes less than 2 seconds.     Coloration: Skin is not jaundiced or pale.     Findings: No bruising, erythema or lesion.  Neurological:     Mental Status: She is alert and oriented to person, place, and time.     Motor: No weakness.     Coordination: Coordination normal.     Gait: Gait normal.  Psychiatric:        Mood and Affect: Mood normal.        Behavior: Behavior normal.        Thought Content: Thought content normal.        Judgment: Judgment normal.     BP 107/68   Pulse 65   Temp 98 F (36.7 C)   Wt 154 lb 12.8 oz (70.2 kg)   SpO2 100%   BMI 29.25 kg/m  Wt Readings from Last 3 Encounters:  06/26/23 154 lb 12.8 oz (70.2 kg)  02/27/23 159 lb 12.8 oz (72.5 kg)  12/12/22 165 lb (74.8 kg)    Lab Results  Component Value Date   TSH 0.988 09/26/2017   Lab Results  Component Value Date   WBC 4.9 10/30/2021   HGB 13.1 10/30/2021   HCT 38.2 10/30/2021   MCV 85.8 10/30/2021   PLT 196 10/30/2021   Lab Results  Component Value Date   NA 139 11/24/2022   K 4.2 11/24/2022   CO2 21 11/24/2022   GLUCOSE 138 (H) 11/24/2022   BUN 11 11/24/2022   CREATININE 0.66 11/24/2022   BILITOT 0.4 11/24/2022   ALKPHOS 57 11/24/2022   AST 14 11/24/2022   ALT 13 11/24/2022   PROT 6.7 11/24/2022   ALBUMIN 4.1 11/24/2022   CALCIUM 9.1 11/24/2022   ANIONGAP 8 06/16/2016   EGFR 114 11/24/2022   Lab Results  Component Value Date    CHOL 186 11/24/2022   Lab Results  Component Value Date   HDL 43 11/24/2022   Lab Results  Component Value Date   LDLCALC 110 (H) 11/24/2022   Lab Results  Component Value Date   TRIG 191 (H) 11/24/2022   Lab Results  Component Value Date   CHOLHDL 4.3 11/24/2022   Lab Results  Component Value Date   HGBA1C 6.7 (A) 06/26/2023      Assessment & Plan:   Problem List Items Addressed This Visit       Respiratory   Allergic rhinitis   Currently well-controlled Continue Zyrtec and Flonase nasal spray        Endocrine   Uncontrolled type 2 diabetes mellitus with hyperglycemia (HCC) - Primary   Lab Results  Component Value Date   HGBA1C 6.7 (A) 06/26/2023  Chronic medical condition currently well-controlled on glipizide 5 mg daily Continue current medication Patient counseled on low-carb diet, engage in regular moderate exercise at least 150 minutes weekly as tolerated Encouraged to get her diabetes eye exam completed Follow-up in 4 months      Relevant Medications   glipiZIDE (GLUCOTROL XL) 5 MG 24 hr tablet     Other   Dyslipidemia, goal LDL below 70   Lab Results  Component Value Date   CHOL 186 11/24/2022   HDL 43 11/24/2022   LDLCALC 110 (H) 11/24/2022   TRIG 191 (H) 11/24/2022   CHOLHDL 4.3 11/24/2022  LDL goal is less than 70, patient declined starting medication for hyperlipidemia, need for medication to reduce the risk of heart attack, and I discussed Avoid fatty fried foods  Encounter for vitamin deficiency screening   Patient requested for vitamin D screening Checking labs      Relevant Orders   VITAMIN D 25 Hydroxy (Vit-D Deficiency, Fractures)    Meds ordered this encounter  Medications   glipiZIDE (GLUCOTROL XL) 5 MG 24 hr tablet    Sig: Take 1 tablet (5 mg total) by mouth daily.    Dispense:  90 tablet    Refill:  1    Follow-up: Return in about 4 months (around 10/26/2023) for CPE.    Donell Beers, FNP

## 2023-06-27 LAB — BASIC METABOLIC PANEL
BUN/Creatinine Ratio: 23 (ref 9–23)
BUN: 14 mg/dL (ref 6–24)
CO2: 20 mmol/L (ref 20–29)
Calcium: 8.9 mg/dL (ref 8.7–10.2)
Chloride: 106 mmol/L (ref 96–106)
Creatinine, Ser: 0.6 mg/dL (ref 0.57–1.00)
Glucose: 134 mg/dL — ABNORMAL HIGH (ref 70–99)
Potassium: 4 mmol/L (ref 3.5–5.2)
Sodium: 139 mmol/L (ref 134–144)
eGFR: 116 mL/min/{1.73_m2} (ref >59)

## 2023-06-27 LAB — VITAMIN D 25 HYDROXY (VIT D DEFICIENCY, FRACTURES): Vit D, 25-Hydroxy: 26.5 ng/mL — ABNORMAL LOW (ref 30.0–100.0)

## 2023-10-29 ENCOUNTER — Ambulatory Visit: Payer: Self-pay | Admitting: Nurse Practitioner

## 2023-12-05 ENCOUNTER — Inpatient Hospital Stay (HOSPITAL_COMMUNITY): Payer: Self-pay

## 2023-12-05 ENCOUNTER — Inpatient Hospital Stay (HOSPITAL_COMMUNITY)
Admission: AD | Admit: 2023-12-05 | Discharge: 2023-12-05 | Disposition: A | Discharge status: Home / Self Care | Payer: Self-pay | Attending: Obstetrics & Gynecology | Admitting: Obstetrics & Gynecology

## 2023-12-05 ENCOUNTER — Encounter (HOSPITAL_COMMUNITY): Payer: Self-pay | Admitting: *Deleted

## 2023-12-05 DIAGNOSIS — N898 Other specified noninflammatory disorders of vagina: Secondary | ICD-10-CM

## 2023-12-05 DIAGNOSIS — O3680X0 Pregnancy with inconclusive fetal viability, not applicable or unspecified: Secondary | ICD-10-CM | POA: Insufficient documentation

## 2023-12-05 DIAGNOSIS — Z758 Other problems related to medical facilities and other health care: Secondary | ICD-10-CM

## 2023-12-05 DIAGNOSIS — O26899 Other specified pregnancy related conditions, unspecified trimester: Secondary | ICD-10-CM

## 2023-12-05 DIAGNOSIS — R109 Unspecified abdominal pain: Secondary | ICD-10-CM | POA: Insufficient documentation

## 2023-12-05 DIAGNOSIS — Z603 Acculturation difficulty: Secondary | ICD-10-CM

## 2023-12-05 DIAGNOSIS — Z3A01 Less than 8 weeks gestation of pregnancy: Secondary | ICD-10-CM

## 2023-12-05 DIAGNOSIS — O24112 Pre-existing diabetes mellitus, type 2, in pregnancy, second trimester: Secondary | ICD-10-CM | POA: Insufficient documentation

## 2023-12-05 DIAGNOSIS — O26891 Other specified pregnancy related conditions, first trimester: Secondary | ICD-10-CM

## 2023-12-05 DIAGNOSIS — R102 Pelvic and perineal pain: Secondary | ICD-10-CM | POA: Insufficient documentation

## 2023-12-05 DIAGNOSIS — O09521 Supervision of elderly multigravida, first trimester: Secondary | ICD-10-CM | POA: Insufficient documentation

## 2023-12-05 LAB — WET PREP, GENITAL
Clue Cells Wet Prep HPF POC: NONE SEEN
Sperm: NONE SEEN
Trich, Wet Prep: NONE SEEN
WBC, Wet Prep HPF POC: <10 (ref <10)
Yeast Wet Prep HPF POC: NONE SEEN

## 2023-12-05 LAB — URINALYSIS, ROUTINE W REFLEX MICROSCOPIC
Bilirubin Urine: NEGATIVE
Glucose, UA: >=500 mg/dL — AB
Ketones, ur: NEGATIVE mg/dL
Nitrite: NEGATIVE
Protein, ur: NEGATIVE mg/dL
Specific Gravity, Urine: 1.018 (ref 1.005–1.030)
pH: 6 (ref 5.0–8.0)

## 2023-12-05 LAB — HCG, QUANTITATIVE, PREGNANCY: hCG, Beta Chain, Quant, S: 588 m[IU]/mL — ABNORMAL HIGH (ref <5)

## 2023-12-05 LAB — POCT PREGNANCY, URINE: Preg Test, Ur: POSITIVE — AB

## 2023-12-05 MED ORDER — TERCONAZOLE 0.4 % VA CREA: 1.0000 | TOPICAL_CREAM | Freq: Every day | VAGINAL | 0 refills | Status: DC

## 2023-12-05 NOTE — MAU Provider Note (Signed)
 History     CSN: [id number]  Arrival date and time: 12/05/23 9088   Event Date/Time   First Provider Initiated Contact with Patient 12/05/23 1159      Chief Complaint  Patient presents with   Vaginal Itching   Vaginal Discharge   Possible Pregnancy   Abdominal Pain   HPI [name] is a 43 y.o. year old [id number] female at [redacted]w[redacted]d weeks gestation who presents to MAU reporting (+) HPT 4 days ago, abdominal pain/cramping x 3 days and vaginal discharge with itching x 2 weeks. She denies VB. She is Type 2 Diabetic and concerned for her baby. She has not started [facility] yet.  OB History     Gravida  8   Para  4   Term  3   Preterm  1   AB  3   Living  4      SAB  3   IAB  0   Ectopic  0   Multiple      Live Births  4           Past Medical History:  Diagnosis Date   Complication of anesthesia    GDM (gestational diabetes mellitus)    Hyperlipidemia    Type 2 diabetes mellitus (HCC)     Past Surgical History:  Procedure Laterality Date   CESAREAN SECTION     for non-reassuring fhr    Family History  Problem Relation Age of Onset   Other Mother        hadn't been sick, just got fever and died.? cause   Hypertension Father    Diabetes Father    Asthma Neg Hx    Eczema Neg Hx    Environmental Allergies Neg Hx     Social History   Tobacco Use   Smoking status: Never   Smokeless tobacco: Never  Vaping Use   Vaping status: Never Used  Substance Use Topics   Alcohol use: No   Drug use: No    Allergies:  Allergies  Allergen Reactions   Other Itching and Rash    ALL NUTS   Peanut Allergen Powder-Dnfp Itching    No medications prior to admission.    Review of Systems  Constitutional: Negative.   HENT: Negative.    Eyes: Negative.   Respiratory: Negative.    Cardiovascular: Negative.   Gastrointestinal: Negative.   Endocrine: Negative.   Genitourinary:  Positive for pelvic pain and vaginal pain (irritation x 2  weeks).  Musculoskeletal: Negative.   Skin: Negative.   Allergic/Immunologic: Negative.   Neurological: Negative.   Hematological: Negative.   Psychiatric/Behavioral: Negative.     Physical Exam   Blood pressure 114/72, pulse 81, temperature 99 F (37.2 C), temperature source Oral, resp. rate 17, height 5' 3 (1.6 m), weight 73.3 kg, last menstrual period 10/25/2023, SpO2 100%.  Physical Exam Vitals and nursing note reviewed.  Constitutional:      Appearance: Normal appearance. She is normal weight.  Cardiovascular:     Rate and Rhythm: Normal rate.  Pulmonary:     Effort: Pulmonary effort is normal.  Genitourinary:    Comments: Swabs collected by patient using blind swab technique  Musculoskeletal:        General: Normal range of motion.  Skin:    General: Skin is warm and dry.  Neurological:     Mental Status: She is alert and oriented to person, place, and time.  Psychiatric:  Mood and Affect: Mood normal.        Behavior: Behavior normal.        Thought Content: Thought content normal.        Judgment: Judgment normal.    MAU Course  Procedures  MDM CCUA UPT WP GC/CT -- Results pending  OB <14 wks TVUS  Results for orders placed or performed during the hospital encounter of 12/05/23 (from the past 24 hours)  Pregnancy, urine POC     Status: Abnormal   Collection Time: 12/05/23 10:21 AM  Result Value Ref Range   Preg Test, Ur POSITIVE (A) NEGATIVE  Urinalysis, Routine w reflex microscopic -Urine, Clean Catch     Status: Abnormal   Collection Time: 12/05/23 10:24 AM  Result Value Ref Range   Color, Urine YELLOW YELLOW   APPearance HAZY (A) CLEAR   Specific Gravity, Urine 1.018 1.005 - 1.030   pH 6.0 5.0 - 8.0   Glucose, UA >=500 (A) NEGATIVE mg/dL   Hgb urine dipstick SMALL (A) NEGATIVE   Bilirubin Urine NEGATIVE NEGATIVE   Ketones, ur NEGATIVE NEGATIVE mg/dL   Protein, ur NEGATIVE NEGATIVE mg/dL   Nitrite NEGATIVE NEGATIVE   Leukocytes,Ua  TRACE (A) NEGATIVE   RBC / HPF 0-5 0 - 5 RBC/hpf   WBC, UA 0-5 0 - 5 WBC/hpf   Bacteria, UA RARE (A) NONE SEEN   Squamous Epithelial / HPF 0-5 0 - 5 /HPF  Wet prep, genital     Status: None   Collection Time: 12/05/23 10:24 AM   Specimen: PATH Cytology Cervicovaginal Ancillary Only  Result Value Ref Range   Yeast Wet Prep HPF POC NONE SEEN NONE SEEN   Trich, Wet Prep NONE SEEN NONE SEEN   Clue Cells Wet Prep HPF POC NONE SEEN NONE SEEN   WBC, Wet Prep HPF POC <10 <10   Sperm NONE SEEN   hCG, quantitative, pregnancy     Status: Abnormal   Collection Time: 12/05/23 12:17 PM  Result Value Ref Range   hCG, Beta Chain, Quant, S 588 (H) <5 mIU/mL    US  OB LESS THAN 14 WEEKS WITH OB TRANSVAGINAL Result Date: 12/05/2023 CLINICAL DATA:  Pelvic cramping, first trimester of pregnancy. EXAM: OBSTETRIC <14 WK US  AND TRANSVAGINAL OB US  TECHNIQUE: Both transabdominal and transvaginal ultrasound examinations were performed for complete evaluation of the gestation as well as the maternal uterus, adnexal regions, and pelvic cul-de-sac. Transvaginal technique was performed to assess early pregnancy. COMPARISON:  None Available. FINDINGS: Intrauterine gestational sac: None Yolk sac:  Not Visualized. Embryo:  Not Visualized. Cardiac Activity: Not Visualized. Subchorionic hemorrhage:  None visualized. Maternal uterus/adnexae: Trace free fluid is noted which most likely is physiologic. Ovaries are unremarkable. Thickened endometrium is noted. IMPRESSION: No intrauterine gestational sac, yolk sac, fetal pole, or cardiac activity visualized. Differential considerations include intrauterine gestation too early to be sonographically visualized, spontaneous abortion, or ectopic pregnancy. Consider follow-up ultrasound in 14 days and serial quantitative beta HCG follow-up. Electronically Signed   By: Lynwood Landy Raddle M.D.   On: 12/05/2023 13:38    Assessment and Plan  1. Vaginal irritation (Primary) - Prescription for:  Terazol 0.4% pv hs x 7 days for presumed candida vaginitis  2. Abdominal cramping affecting pregnancy - Advised can take Tylenol  1000 mg every 8 hours prn pain  3. Pregnancy of unknown anatomic location - Reviewed that HCG level is too low to detect location of pregnancy  4. Language barrier - [employer] Video Arabic  Interpreter, Husam #[id number] used  5. [redacted] weeks gestation of pregnancy   - Discharge home - Return to MAU on Friday 12/07/2023 for repeat hCG - Patient verbalized an understanding of the plan of care and agrees.    Ala Cart, CNM 12/05/2023, 11:59 AM

## 2023-12-05 NOTE — MAU Note (Signed)
 Mariah Joyce is a 43 y.o. at Unknown here in MAU reporting: +HPT 4 days ago.  Having d/c with itching, started 2 wks ago.  Cramping in lower abd x3 days. No bleeding. LMP: 7.3 Onset of complaint: 2 wks ago Pain score: mild Vitals:   12/05/23 0955  BP: 127/75  Pulse: 94  Resp: 17  Temp: 99 F (37.2 C)  SpO2: 100%     Lab orders placed from triage:  UA, UPT, vag swabs

## 2023-12-06 LAB — GC/CHLAMYDIA PROBE AMP (~~LOC~~) NOT AT ARMC
Chlamydia: NEGATIVE
Comment: NEGATIVE
Comment: NORMAL
Neisseria Gonorrhea: NEGATIVE

## 2023-12-07 ENCOUNTER — Encounter (HOSPITAL_COMMUNITY): Payer: Self-pay | Admitting: Obstetrics and Gynecology

## 2023-12-07 ENCOUNTER — Inpatient Hospital Stay (HOSPITAL_COMMUNITY)
Admission: AD | Admit: 2023-12-07 | Discharge: 2023-12-07 | Disposition: A | Discharge status: Home / Self Care | Payer: Self-pay | Attending: Obstetrics and Gynecology | Admitting: Obstetrics and Gynecology

## 2023-12-07 DIAGNOSIS — O3680X0 Pregnancy with inconclusive fetal viability, not applicable or unspecified: Secondary | ICD-10-CM

## 2023-12-07 DIAGNOSIS — Z3A01 Less than 8 weeks gestation of pregnancy: Secondary | ICD-10-CM

## 2023-12-07 DIAGNOSIS — O09521 Supervision of elderly multigravida, first trimester: Secondary | ICD-10-CM | POA: Insufficient documentation

## 2023-12-07 LAB — HCG, QUANTITATIVE, PREGNANCY: hCG, Beta Chain, Quant, S: 1497 m[IU]/mL — ABNORMAL HIGH (ref <5)

## 2023-12-07 NOTE — MAU Note (Addendum)
 Mariah Joyce is a 43 y.o. at [redacted]w[redacted]d here in MAU reporting: here for repeat blood work.  Doing ok now.  Has had some cramping at times. No bleeding. Onset of complaint: ongoing Pain score: none Vitals:   12/07/23 1116  BP: 111/72  Pulse: 84  Resp: 16  Temp: 99.1 F (37.3 C)  SpO2: 100%      Lab orders placed from triage:  HCG drawn

## 2023-12-07 NOTE — MAU Provider Note (Signed)
 Chief Complaint: Follow-up  SUBJECTIVE HPI: Mariah Joyce is a 43 y.o. [id number] at [redacted]w[redacted]d by LMP who presents to maternity admissions reporting need for repeat HCG.  Patient presented for abdominal cramping and vaginal irritation 8/13. Had ectopic work-up. Revealed quant 588, ultrasound unremarkable, diagnosed with pregnancy of unknown location. Treated for yeast infection as well. Since has had mild cramping (none currently). Denies VB, urinary symptoms, fever/chills.  HPI  Past Medical History:  Diagnosis Date   Complication of anesthesia    GDM (gestational diabetes mellitus)    Hyperlipidemia    Type 2 diabetes mellitus (HCC)    Past Surgical History:  Procedure Laterality Date   CESAREAN SECTION     for non-reassuring fhr   Social History   Socioeconomic History   Marital status: Married    Spouse name: Not on file   Number of children: 4   Years of education: Not on file   Highest education level: Not on file  Occupational History   Not on file  Tobacco Use   Smoking status: Never   Smokeless tobacco: Never  Vaping Use   Vaping status: Never Used  Substance and Sexual Activity   Alcohol use: No   Drug use: No   Sexual activity: Yes    Birth control/protection: None  Other Topics Concern   Not on file  Social History Narrative   Lives with her husband and children    Social Drivers of Corporate investment banker Strain: Not on file  Food Insecurity: No Food Insecurity (12/12/2022)   Hunger Vital Sign    Worried About Running Out of Food in the Last Year: Never true    Ran Out of Food in the Last Year: Never true  Transportation Needs: No Transportation Needs (12/12/2022)   PRAPARE - Administrator, Civil Service (Medical): No    Lack of Transportation (Non-Medical): No  Physical Activity: Not on file  Stress: Not on file  Social Connections: Not on file  Intimate Partner Violence: Not on file   No current facility-administered  medications on file prior to encounter.   Current Outpatient Medications on File Prior to Encounter  Medication Sig Dispense Refill   Accu-Chek Softclix Lancets lancets Use as instructed to check blood sugars bid 200 each 12   Blood Glucose Monitoring Suppl (ACCU-CHEK GUIDE ME) w/Device KIT 1 each by Does not apply route in the morning and at bedtime. 1 kit 0   fluticasone  (FLONASE ) 50 MCG/ACT nasal spray      glipiZIDE  (GLUCOTROL  XL) 5 MG 24 hr tablet Take 1 tablet (5 mg total) by mouth daily. 90 tablet 1   glucose blood (ACCU-CHEK GUIDE) test strip Use as instructed to check blood sugars bid 200 each 12   terconazole  (TERAZOL 7 ) 0.4 % vaginal cream Place 1 applicator vaginally at bedtime. Use for seven days 45 g 0   Allergies  Allergen Reactions   Other Itching and Rash    ALL NUTS   Peanut Allergen Powder-Dnfp Itching    ROS:  Pertinent positives/negatives listed above.  I have reviewed patient's Past Medical Hx, Surgical Hx, Family Hx, Social Hx, medications and allergies.   Physical Exam  Patient Vitals for the past 24 hrs:  BP Temp Temp src Pulse Resp SpO2 Height Weight  12/07/23 1116 111/72 99.1 F (37.3 C) Oral 84 16 100 % 5' 3 (1.6 m) 73 kg   Constitutional: Well-developed, well-nourished female in no acute distress Cardiovascular: normal rate Respiratory:  normal effort GI: Abd soft, non-tender. Pos BS x 4 MS: Extremities nontender, no edema, normal ROM Neurologic: Alert and oriented x 4  LAB RESULTS Results for orders placed or performed during the hospital encounter of 12/07/23 (from the past 24 hours)  hCG, quantitative, pregnancy     Status: Abnormal   Collection Time: 12/07/23 11:09 AM  Result Value Ref Range   hCG, Beta Chain, Quant, S 1,497 (H) <5 mIU/mL       IMAGING US  OB LESS THAN 14 WEEKS WITH OB TRANSVAGINAL Result Date: 12/05/2023 CLINICAL DATA:  Pelvic cramping, first trimester of pregnancy. EXAM: OBSTETRIC <14 WK US  AND TRANSVAGINAL OB US   TECHNIQUE: Both transabdominal and transvaginal ultrasound examinations were performed for complete evaluation of the gestation as well as the maternal uterus, adnexal regions, and pelvic cul-de-sac. Transvaginal technique was performed to assess early pregnancy. COMPARISON:  None Available. FINDINGS: Intrauterine gestational sac: None Yolk sac:  Not Visualized. Embryo:  Not Visualized. Cardiac Activity: Not Visualized. Subchorionic hemorrhage:  None visualized. Maternal uterus/adnexae: Trace free fluid is noted which most likely is physiologic. Ovaries are unremarkable. Thickened endometrium is noted. IMPRESSION: No intrauterine gestational sac, yolk sac, fetal pole, or cardiac activity visualized. Differential considerations include intrauterine gestation too early to be sonographically visualized, spontaneous abortion, or ectopic pregnancy. Consider follow-up ultrasound in 14 days and serial quantitative beta HCG follow-up. Electronically Signed   By: Lynwood Landy Raddle M.D.   On: 12/05/2023 13:38    MAU Management/MDM: Orders Placed This Encounter  Procedures   hCG, quantitative, pregnancy   Discharge patient    No orders of the defined types were placed in this encounter.   Patient presents for repeat HCG in setting of pregnancy of unknown location. Suspect pregnancy too early to visualize given low quant. Is stable without any current complaints. Patient given option to wait in MAU for quant results or wait for a call at home. She would like to wait at home. Amal, [id number], arabic interpreter used for encounter.  HCG increased from 588 to 1497 over 48 hours. Mohammed, Arabic interpreter, used to leave voice mail for patient. Will need repeat ultrasound in 10-14 days to assess for viability. Order placed.  ASSESSMENT 1. Pregnancy, location unknown     PLAN Discharge home with strict return precautions. Allergies as of 12/07/2023       Reactions   Other Itching, Rash   ALL NUTS   Peanut  Allergen Powder-dnfp Itching        Medication List     STOP taking these medications    cetirizine  10 MG tablet Commonly known as: ZYRTEC    famotidine  20 MG tablet Commonly known as: Pepcid    Insulin  Pen Needle 32G X 4 MM Misc       TAKE these medications    Accu-Chek Guide Me w/Device Kit 1 each by Does not apply route in the morning and at bedtime.   Accu-Chek Guide test strip Generic drug: glucose blood Use as instructed to check blood sugars bid   Accu-Chek Softclix Lancets lancets Use as instructed to check blood sugars bid   fluticasone  50 MCG/ACT nasal spray Commonly known as: FLONASE    glipiZIDE  5 MG 24 hr tablet Commonly known as: GLUCOTROL  XL Take 1 tablet (5 mg total) by mouth daily.   terconazole  0.4 % vaginal cream Commonly known as: TERAZOL 7  Place 1 applicator vaginally at bedtime. Use for seven days         Almarie Moats, MD [facility] Fellow 12/07/2023  1:41 PM

## 2023-12-18 ENCOUNTER — Ambulatory Visit (INDEPENDENT_AMBULATORY_CARE_PROVIDER_SITE_OTHER): Payer: Self-pay | Admitting: Nurse Practitioner

## 2023-12-18 ENCOUNTER — Encounter: Payer: Self-pay | Admitting: Nurse Practitioner

## 2023-12-18 VITALS — BP 103/55 | HR 63 | Temp 97.7°F | Wt 160.0 lb

## 2023-12-18 DIAGNOSIS — Z1329 Encounter for screening for other suspected endocrine disorder: Secondary | ICD-10-CM

## 2023-12-18 DIAGNOSIS — Z1321 Encounter for screening for nutritional disorder: Secondary | ICD-10-CM

## 2023-12-18 DIAGNOSIS — Z130 Encounter for screening for diseases of the blood and blood-forming organs and certain disorders involving the immune mechanism: Secondary | ICD-10-CM

## 2023-12-18 DIAGNOSIS — Z0000 Encounter for general adult medical examination without abnormal findings: Secondary | ICD-10-CM | POA: Insufficient documentation

## 2023-12-18 DIAGNOSIS — Z13228 Encounter for screening for other metabolic disorders: Secondary | ICD-10-CM

## 2023-12-18 DIAGNOSIS — Z3490 Encounter for supervision of normal pregnancy, unspecified, unspecified trimester: Secondary | ICD-10-CM | POA: Insufficient documentation

## 2023-12-18 DIAGNOSIS — E1165 Type 2 diabetes mellitus with hyperglycemia: Secondary | ICD-10-CM

## 2023-12-18 DIAGNOSIS — Z3A01 Less than 8 weeks gestation of pregnancy: Secondary | ICD-10-CM

## 2023-12-18 LAB — POCT GLYCOSYLATED HEMOGLOBIN (HGB A1C): Hemoglobin A1C: 7 % — AB (ref 4.0–5.6)

## 2023-12-18 MED ORDER — LANTUS SOLOSTAR 100 UNIT/ML ~~LOC~~ SOPN: 10.0000 [IU] | PEN_INJECTOR | Freq: Every day | SUBCUTANEOUS | 3 refills | Status: DC

## 2023-12-18 MED ORDER — PRENATAL GUMMIES/DHA & FA 0.4-32.5 MG PO CHEW: 1.0000 | CHEWABLE_TABLET | Freq: Every day | ORAL | 2 refills | Status: AC

## 2023-12-18 NOTE — Assessment & Plan Note (Signed)
 Lab Results  Component Value Date   HGBA1C 7.0 (A) 12/18/2023   Type 2 diabetes mellitus in pregnancy Type 2 diabetes with recent A1c increase from 6.7 to 7.0. Blood glucose levels before meals range from 164 to 170. Currently [redacted] weeks pregnant. Considering reinitiating insulin  therapy during pregnancy.  Stop glipizide  and start Lantus  10 units daily -Patient referred to endocrinology, CBG goals discussed - Advise dietary modifications to control blood sugar, including reducing intake of sweets, soda, juice, and high carbohydrate foods. - Encourage eating plenty of vegetables and protein. Follow-up in 4 weeks

## 2023-12-18 NOTE — Progress Notes (Addendum)
 Complete physical exam  Patient: Mariah Joyce   DOB: 1981/03/10   43 y.o. Female  MRN: [id number]  Subjective:    Chief Complaint  Patient presents with   Annual Exam    fasting   Discussed the use of AI scribe software for clinical note transcription with the patient, who gave verbal consent to proceed.  History of Present Illness Mariah Joyce is a 43 year old female with type 2 diabetes who presents for an annual physical exam and pregnancy follow-up.  Interpretation services provided by medical interpreter  She is approximately seven weeks pregnant, discovered during an emergency room visit for fever diagnosed with pregnancy of unknown location.An ultrasound is scheduled for September 2nd. She experiences warmth with occasional chills but no abdominal pain, nausea, vomiting, fever, chills, chest pain, shortness of breath, or vaginal bleeding.   She has type 2 diabetes managed with glipizide  5 mg daily. Blood sugar levels before meals range from 164 to 170 mg/dL, and her most recent YaJ8r is 7.0%, up from 6.7% five months ago. She monitors her diet to avoid foods that elevate her blood sugar, focusing on vegetables and proteins.  She uses tioconazole vaginal cream as needed. She has not had an eye exam in the past 12 months due to Medicaid not covering the cost, despite the recommendation for annual eye exams for diabetic patients. She had normal mammograms in September and December of the previous year.  No headaches, sneezing, runny nose, breast tenderness, wounds on feet, or calf pain when walking. She maintains physical activity by moving around regularly.      Assessment & Plan       Most recent fall risk assessment:    02/27/2023    9:07 AM  Fall Risk   Falls in the past year? 0  Number falls in past yr: 0  Injury with Fall? 0  Risk for fall due to : No Fall Risks  Follow up Falls evaluation completed     Most recent depression  screenings:    12/18/2023    9:04 AM 02/27/2023    9:07 AM  PHQ 2/9 Scores  PHQ - 2 Score 0 0  PHQ- 9 Score 0         Patient Care Team: Margie Urbanowicz R, FNP as PCP - General (Nurse Practitioner) Patient, No Pcp Per (General Practice)   Outpatient Medications Prior to Visit  Medication Sig Note   Accu-Chek Softclix Lancets lancets Use as instructed to check blood sugars bid    Blood Glucose Monitoring Suppl (ACCU-CHEK GUIDE ME) w/Device KIT 1 each by Does not apply route in the morning and at bedtime.    glucose blood (ACCU-CHEK GUIDE) test strip Use as instructed to check blood sugars bid    terconazole  (TERAZOL 7 ) 0.4 % vaginal cream Place 1 applicator vaginally at bedtime. Use for seven days    [DISCONTINUED] glipiZIDE  (GLUCOTROL  XL) 5 MG 24 hr tablet Take 1 tablet (5 mg total) by mouth daily. 12/18/2023: starting lantus  due to pregnancy   fluticasone  (FLONASE ) 50 MCG/ACT nasal spray  (Patient not taking: Reported on 12/18/2023)    No facility-administered medications prior to visit.    Review of Systems  Constitutional:  Negative for appetite change, chills, fatigue and fever.  HENT:  Negative for congestion, postnasal drip, rhinorrhea and sneezing.   Eyes:  Negative for pain, discharge and itching.  Respiratory:  Negative for cough, shortness of breath and wheezing.   Cardiovascular:  Negative for  chest pain, palpitations and leg swelling.  Gastrointestinal:  Negative for abdominal pain, constipation, nausea and vomiting.  Endocrine: Negative for polyphagia and polyuria.  Genitourinary:  Negative for difficulty urinating, dysuria, flank pain and frequency.  Musculoskeletal:  Negative for arthralgias, back pain, joint swelling and myalgias.  Skin:  Negative for color change, pallor, rash and wound.  Allergic/Immunologic: Positive for food allergies.  Neurological:  Negative for dizziness, facial asymmetry, weakness, numbness and headaches.  Psychiatric/Behavioral:   Negative for behavioral problems, confusion, self-injury and suicidal ideas.        Objective:     BP (!) 103/55   Pulse 63   Temp 97.7 F (36.5 C)   Wt 160 lb (72.6 kg)   LMP 10/25/2023   SpO2 100%   BMI 28.34 kg/m    Physical Exam Vitals and nursing note reviewed. Exam conducted with a chaperone present.  Constitutional:      General: She is not in acute distress.    Appearance: Normal appearance. She is not ill-appearing, toxic-appearing or diaphoretic.  HENT:     Right Ear: Tympanic membrane, ear canal and external ear normal. There is no impacted cerumen.     Left Ear: Tympanic membrane, ear canal and external ear normal. There is no impacted cerumen.     Nose: Nose normal. No congestion or rhinorrhea.     Mouth/Throat:     Mouth: Mucous membranes are moist.     Pharynx: Oropharynx is clear. No oropharyngeal exudate or posterior oropharyngeal erythema.  Eyes:     General: No scleral icterus.       Right eye: No discharge.        Left eye: No discharge.     Extraocular Movements: Extraocular movements intact.     Conjunctiva/sclera: Conjunctivae normal.  Neck:     Vascular: No carotid bruit.  Cardiovascular:     Rate and Rhythm: Normal rate and regular rhythm.     Pulses: Normal pulses.     Heart sounds: Normal heart sounds. No murmur heard.    No friction rub. No gallop.  Pulmonary:     Effort: Pulmonary effort is normal. No respiratory distress.     Breath sounds: Normal breath sounds. No stridor. No wheezing, rhonchi or rales.  Chest:     Chest wall: No mass, lacerations, deformity, swelling, tenderness, crepitus or edema.  Breasts:    Tanner Score is 5.     Breasts are symmetrical.     Right: No swelling, bleeding, inverted nipple, mass, nipple discharge, skin change or tenderness.     Left: No swelling, bleeding, inverted nipple, mass, nipple discharge, skin change or tenderness.  Abdominal:     General: Bowel sounds are normal. There is no distension.      Palpations: Abdomen is soft. There is no mass.     Tenderness: There is no abdominal tenderness. There is no right CVA tenderness, left CVA tenderness, guarding or rebound.     Hernia: No hernia is present.  Musculoskeletal:        General: No swelling, tenderness, deformity or signs of injury.     Cervical back: Normal range of motion and neck supple. No rigidity or tenderness.     Right lower leg: No edema.     Left lower leg: No edema.  Lymphadenopathy:     Cervical: No cervical adenopathy.     Upper Body:     Right upper body: No supraclavicular, axillary or pectoral adenopathy.     Left  upper body: No supraclavicular, axillary or pectoral adenopathy.  Skin:    General: Skin is warm and dry.     Capillary Refill: Capillary refill takes less than 2 seconds.     Coloration: Skin is not jaundiced or pale.     Findings: No bruising, erythema, lesion or rash.  Neurological:     Mental Status: She is alert and oriented to person, place, and time.     Cranial Nerves: No cranial nerve deficit.     Sensory: No sensory deficit.     Motor: No weakness.     Coordination: Coordination normal.     Gait: Gait normal.     Deep Tendon Reflexes: Reflexes normal.  Psychiatric:        Mood and Affect: Mood normal.        Behavior: Behavior normal.        Thought Content: Thought content normal.        Judgment: Judgment normal.     Results for orders placed or performed in visit on 12/18/23  POCT glycosylated hemoglobin (Hb A1C)  Result Value Ref Range   Hemoglobin A1C 7.0 (A) 4.0 - 5.6 %   HbA1c POC (<> result, manual entry)     HbA1c, POC (prediabetic range)     HbA1c, POC (controlled diabetic range)         Assessment & Plan:    Routine Health Maintenance and Physical Exam  Immunization History  Administered Date(s) Administered   PPD Test 01/23/2023   Tdap 10/14/2021    Health Maintenance  Topic Date Due   OPHTHALMOLOGY EXAM  Never done   Pneumococcal Vaccine (1 of 2  - PCV) Never done   Hepatitis B Vaccines 19-59 Average Risk (1 of 3 - 19+ 3-dose series) Never done   HPV VACCINES (1 - 3-dose SCDM series) Never done   COVID-19 Vaccine (1 - 2024-25 season) Never done   Diabetic kidney evaluation - Urine ACR  11/24/2023   INFLUENZA VACCINE  11/23/2023   HEMOGLOBIN A1C  06/19/2024   Diabetic kidney evaluation - eGFR measurement  06/25/2024   FOOT EXAM  12/17/2024   Cervical Cancer Screening (HPV/Pap Cotest)  12/12/2027   DTaP/Tdap/Td (2 - Td or Tdap) 10/15/2031   Hepatitis C Screening  Completed   HIV Screening  Completed   Meningococcal B Vaccine  Aged Out    Discussed health benefits of physical activity, and encouraged her to engage in regular exercise appropriate for her age and condition.  Problem List Items Addressed This Visit       Endocrine   Uncontrolled type 2 diabetes mellitus with hyperglycemia (HCC)   Lab Results  Component Value Date   HGBA1C 7.0 (A) 12/18/2023   Type 2 diabetes mellitus in pregnancy Type 2 diabetes with recent A1c increase from 6.7 to 7.0. Blood glucose levels before meals range from 164 to 170. Currently [redacted] weeks pregnant. Considering reinitiating insulin  therapy during pregnancy.  Stop glipizide  and start Lantus  10 units daily -Patient referred to endocrinology, CBG goals discussed - Advise dietary modifications to control blood sugar, including reducing intake of sweets, soda, juice, and high carbohydrate foods. - Encourage eating plenty of vegetables and protein. Follow-up in 4 weeks      Relevant Medications   insulin  glargine (LANTUS  SOLOSTAR) 100 UNIT/ML Solostar Pen   Other Relevant Orders   Microalbumin/Creatinine Ratio, Urine   Ambulatory referral to Endocrinology   Ambulatory referral to Ophthalmology   CMP14+EGFR   Lipid panel  Other   Routine general medical examination at health care facility - Primary   Routine wellness visit with no new medication allergies. Intermittent warmth and chills  likely due to early pregnancy. No recent eye exam due to Medicaid coverage issues. Previous mammogram and ultrasound normal. - Perform diabetic foot exam to check sensations. - Defer mammogram until after pregnancy. - Encourage annual eye exam to check for diabetic retinopathy. - Performed breast exam. Counseling done include healthy lifestyle involving committing to 150 minutes of exercise per week, heart healthy diet,. The importance of adequate sleep also discussed.  Regular use of seat belt and home safety were also discussed .        Relevant Orders   POCT glycosylated hemoglobin (Hb A1C) (Completed)   Microalbumin/Creatinine Ratio, Urine   Pregnancy   Most recent ultrasound showed  No intrauterine gestational sac, yolk sac, fetal pole, or cardiac activity visualized. Differential considerations include intrauterine gestation too early to be sonographically visualized, spontaneous abortion, or ectopic pregnancy. Consider follow-up ultrasound in 14 days and serial quantitative beta HCG follow-up.      Relevant Medications   Prenatal MV-Min-FA-Omega-3 (PRENATAL GUMMIES/DHA & FA) 0.4-32.5 MG CHEW   Other Visit Diagnoses       Screening for endocrine, nutritional, metabolic and immunity disorder       Relevant Orders   CBC      Return in about 4 weeks (around 01/15/2024) for DM.     Kalid Ghan R Franklin Baumbach, FNP

## 2023-12-18 NOTE — Assessment & Plan Note (Signed)
 Routine wellness visit with no new medication allergies. Intermittent warmth and chills likely due to early pregnancy. No recent eye exam due to Medicaid coverage issues. Previous mammogram and ultrasound normal. - Perform diabetic foot exam to check sensations. - Defer mammogram until after pregnancy. - Encourage annual eye exam to check for diabetic retinopathy. - Performed breast exam. Counseling done include healthy lifestyle involving committing to 150 minutes of exercise per week, heart healthy diet,. The importance of adequate sleep also discussed.  Regular use of seat belt and home safety were also discussed .

## 2023-12-18 NOTE — Assessment & Plan Note (Signed)
 Most recent ultrasound showed  No intrauterine gestational sac, yolk sac, fetal pole, or cardiac activity visualized. Differential considerations include intrauterine gestation too early to be sonographically visualized, spontaneous abortion, or ectopic pregnancy. Consider follow-up ultrasound in 14 days and serial quantitative beta HCG follow-up.

## 2023-12-18 NOTE — Patient Instructions (Addendum)
 Goal for fasting blood sugar ranges from 80 to 120 and 2 hours after any meal or at bedtime should be between 130 to 170.    1. Routine general medical examination at health care facility (Primary)  - POCT glycosylated hemoglobin (Hb A1C) - Microalbumin/Creatinine Ratio, Urine  2. Uncontrolled type 2 diabetes mellitus with hyperglycemia (HCC)  - insulin  glargine (LANTUS  SOLOSTAR) 100 UNIT/ML Solostar Pen; Inject 10 Units into the skin daily.  Dispense: 15 mL; Refill: 3 - Microalbumin/Creatinine Ratio, Urine - Ambulatory referral to Endocrinology - Ambulatory referral to Ophthalmology - CMP14+EGFR - Lipid panel  3. Screening for endocrine, nutritional, metabolic and immunity disorder  - CBC    It is important that you exercise regularly at least 30 minutes 5 times a week as tolerated  Think about what you will eat, plan ahead. Choose  clean, green, fresh or frozen over canned, processed or packaged foods which are more sugary, salty and fatty. 70 to 75% of food eaten should be vegetables and fruit. Three meals at set times with snacks allowed between meals, but they must be fruit or vegetables. Aim to eat over a 12 hour period , example 7 am to 7 pm, and STOP after  your last meal of the day. Drink water,generally about 64 ounces per day, no other drink is as healthy. Fruit juice is best enjoyed in a healthy way, by EATING the fruit.  Thanks for choosing [facility] we consider it a privelige to serve you.

## 2023-12-19 ENCOUNTER — Ambulatory Visit: Payer: Self-pay | Admitting: Nurse Practitioner

## 2023-12-19 LAB — CMP14+EGFR
ALT: 10 IU/L (ref 0–32)
AST: 12 IU/L (ref 0–40)
Albumin: 4.1 g/dL (ref 3.9–4.9)
Alkaline Phosphatase: 48 IU/L (ref 44–121)
BUN/Creatinine Ratio: 14 (ref 9–23)
BUN: 9 mg/dL (ref 6–24)
Bilirubin Total: 0.5 mg/dL (ref 0.0–1.2)
CO2: 21 mmol/L (ref 20–29)
Calcium: 9.1 mg/dL (ref 8.7–10.2)
Chloride: 103 mmol/L (ref 96–106)
Creatinine, Ser: 0.66 mg/dL (ref 0.57–1.00)
Globulin, Total: 2.4 g/dL (ref 1.5–4.5)
Glucose: 131 mg/dL — ABNORMAL HIGH (ref 70–99)
Potassium: 3.7 mmol/L (ref 3.5–5.2)
Sodium: 136 mmol/L (ref 134–144)
Total Protein: 6.5 g/dL (ref 6.0–8.5)
eGFR: 113 mL/min/1.73 (ref >59)

## 2023-12-19 LAB — CBC
Hematocrit: 42.6 % (ref 34.0–46.6)
Hemoglobin: 13.5 g/dL (ref 11.1–15.9)
MCH: 29.5 pg (ref 26.6–33.0)
MCHC: 31.7 g/dL (ref 31.5–35.7)
MCV: 93 fL (ref 79–97)
Platelets: 167 x10E3/uL (ref 150–450)
RBC: 4.57 x10E6/uL (ref 3.77–5.28)
RDW: 12.2 % (ref 11.7–15.4)
WBC: 4.2 x10E3/uL (ref 3.4–10.8)

## 2023-12-19 LAB — LIPID PANEL
Chol/HDL Ratio: 2.9 ratio (ref 0.0–4.4)
Cholesterol, Total: 161 mg/dL (ref 100–199)
HDL: 55 mg/dL (ref >39)
LDL Chol Calc (NIH): 86 mg/dL (ref 0–99)
Triglycerides: 113 mg/dL (ref 0–149)
VLDL Cholesterol Cal: 20 mg/dL (ref 5–40)

## 2023-12-19 LAB — MICROALBUMIN / CREATININE URINE RATIO
Creatinine, Urine: 51.5 mg/dL
Microalb/Creat Ratio: <6 mg/g{creat} (ref 0–29)
Microalbumin, Urine: <3 ug/mL

## 2023-12-20 ENCOUNTER — Other Ambulatory Visit: Payer: Self-pay

## 2023-12-20 DIAGNOSIS — O3680X0 Pregnancy with inconclusive fetal viability, not applicable or unspecified: Secondary | ICD-10-CM

## 2023-12-25 ENCOUNTER — Other Ambulatory Visit: Payer: Self-pay

## 2023-12-25 ENCOUNTER — Other Ambulatory Visit (INDEPENDENT_AMBULATORY_CARE_PROVIDER_SITE_OTHER): Payer: Self-pay

## 2023-12-25 DIAGNOSIS — O3680X0 Pregnancy with inconclusive fetal viability, not applicable or unspecified: Secondary | ICD-10-CM

## 2023-12-25 DIAGNOSIS — Z3491 Encounter for supervision of normal pregnancy, unspecified, first trimester: Secondary | ICD-10-CM

## 2023-12-25 DIAGNOSIS — Z3A01 Less than 8 weeks gestation of pregnancy: Secondary | ICD-10-CM | POA: Diagnosis not present

## 2024-01-04 ENCOUNTER — Other Ambulatory Visit: Payer: Self-pay | Admitting: Nurse Practitioner

## 2024-01-04 DIAGNOSIS — E1165 Type 2 diabetes mellitus with hyperglycemia: Secondary | ICD-10-CM

## 2024-01-08 ENCOUNTER — Encounter (HOSPITAL_COMMUNITY): Payer: Self-pay | Admitting: Obstetrics & Gynecology

## 2024-01-08 ENCOUNTER — Ambulatory Visit: Payer: Self-pay

## 2024-01-08 ENCOUNTER — Inpatient Hospital Stay (HOSPITAL_COMMUNITY)
Admission: AD | Admit: 2024-01-08 | Discharge: 2024-01-08 | Disposition: A | Discharge status: Home / Self Care | Attending: Obstetrics & Gynecology | Admitting: Obstetrics & Gynecology

## 2024-01-08 ENCOUNTER — Other Ambulatory Visit: Payer: Self-pay

## 2024-01-08 VITALS — BP 120/76 | HR 88

## 2024-01-08 DIAGNOSIS — Z3A10 10 weeks gestation of pregnancy: Secondary | ICD-10-CM | POA: Diagnosis not present

## 2024-01-08 DIAGNOSIS — Z603 Acculturation difficulty: Secondary | ICD-10-CM | POA: Insufficient documentation

## 2024-01-08 DIAGNOSIS — O034 Incomplete spontaneous abortion without complication: Secondary | ICD-10-CM

## 2024-01-08 DIAGNOSIS — O0990 Supervision of high risk pregnancy, unspecified, unspecified trimester: Secondary | ICD-10-CM | POA: Insufficient documentation

## 2024-01-08 DIAGNOSIS — E1165 Type 2 diabetes mellitus with hyperglycemia: Secondary | ICD-10-CM

## 2024-01-08 MED ORDER — MISOPROSTOL 200 MCG PO TABS: ORAL_TABLET | ORAL | 1 refills | Status: AC

## 2024-01-08 MED ORDER — OXYCODONE HCL 5 MG PO CAPS: 5.0000 mg | ORAL_CAPSULE | ORAL | 0 refills | Status: DC | PRN

## 2024-01-08 MED ORDER — ONDANSETRON 4 MG PO TBDP: 4.0000 mg | ORAL_TABLET | Freq: Four times a day (QID) | ORAL | 0 refills | Status: AC | PRN

## 2024-01-08 MED ORDER — IBUPROFEN 800 MG PO TABS: 800.0000 mg | ORAL_TABLET | Freq: Three times a day (TID) | ORAL | 3 refills | Status: AC | PRN

## 2024-01-08 NOTE — Progress Notes (Signed)
 Pt at [facility] for New OB Intake visit.  Pt is expected to be 10 weeks 5 days by Ultrasound at 6 weeks.  At beginning of visit pt states she began to have abdominal pain and vaginal bleeding 6 days ago that was heavy, it's currently light but she feels like something is stuck inside.  Patient's daughter was interpreting for her while waiting for [facility] interpreter to arrive.  She also reported having miscarriages in the past and feels like that is what is currently happening.  RN consulted situation with Dr. Lola, who stated pt should go to MAU for  evaluation and possible treatment.  Advised pt to go to MAU at this time per provider.  Pt asked if she can go tomorrow instead, RN strongly advised she go to MAU at [facility] immediately.  Pt daughter states she will take her to hospital at this time.  RN called MAU charge nurse and updated her about pt en route to hospital.    [place], RN

## 2024-01-08 NOTE — Discharge Instructions (Addendum)
 You were seen for bleeding. On exam you had some tissue passing through your cervix. Your ultrasound showed some clot in your uterus. You were sent 4 medication to help you pass this at your home.   - Cytotec  -- this will help your uterus contract and push out the clots/tissue - Zofran  -- helps with any nausea. I recommend you take this at the same time as the cytotec  - Ibuprofen  -- helps with pain. I recommend taking this at the same time as you take the cytotec  - Oxycodone -- take this only for severe pain   You need to call or go to the Maternity Assessment Unit (MAU), [facility] , Entrance C:  If you have heavier bleeding that soaks through more that 2 pads per hour for an hour of more If you bleed so much that you feel like your might pass out If you have significant abdominal pain that is not improved with Tylenol   If you develop a fever > 100.5

## 2024-01-08 NOTE — MAU Note (Signed)
 Mariah Joyce is a 43 y.o. at [redacted]w[redacted]d here in MAU reporting: sent form clinic. Has been bleeding for about 5-6 days. Was heavy the last few days, several pads.now is lighter. Feels like something is still there, ? Pressure sometimes.  Pain in lower abd, cramping. Onset of complaint: 5-6 days Pain score: 5 Vitals:   01/08/24 1707  BP: 111/67  Pulse: 69  Resp: 17  Temp: 100.1 F (37.8 C)  SpO2: 100%      Lab orders placed from triage:

## 2024-01-08 NOTE — MAU Provider Note (Signed)
 History     CSN: [id number]  Arrival date and time: 01/08/24 1553   Event Date/Time   First Provider Initiated Contact with Patient 01/08/24 1730      Chief Complaint  Patient presents with   Vaginal Bleeding    42 y.o. [id number] [redacted]w[redacted]d here with complaints of bleeding for 6 days, heavy with clots and then became lighter today. Reports cramping in lower abdomen present during this time as well. Denies fevers, chills. Nausea, vomiting. Denies dizziness and lightheadedness.   +FM, denies LOF, VB, contractions, vaginal discharge.  Vaginal Bleeding Pertinent negatives include no abdominal pain, back pain, chills, diarrhea, dysuria, fever, flank pain, nausea, rash, sore throat or vomiting.    OB History     Gravida  8   Para  4   Term  3   Preterm  1   AB  3   Living  4      SAB  3   IAB  0   Ectopic  0   Multiple      Live Births  4           Past Medical History:  Diagnosis Date   Allergic rhinitis 06/26/2023   Complication of anesthesia    Dyslipidemia, goal LDL below 70 06/26/2023   Exposure to sexually transmitted disease (STD) 09/26/2017   GDM (gestational diabetes mellitus)    Hyperlipidemia    Infertility, female 02/27/2023   Mass of right breast 02/27/2023   Menstrual migraine with status migrainosus, not intractable 11/24/2022   Obesity 03/10/2014   Patient desires pregnancy 09/26/2017   Preterm premature rupture of membranes in third trimester 10/01/2018   Type 2 diabetes mellitus (HCC)    Vaginal pruritus 06/29/2016    Past Surgical History:  Procedure Laterality Date   CESAREAN SECTION     for non-reassuring fhr    Family History  Problem Relation Age of Onset   Other Mother        hadn't been sick, just got fever and died.? cause   Hypertension Father    Diabetes Father    Asthma Neg Hx    Eczema Neg Hx    Environmental Allergies Neg Hx     Social History   Tobacco Use   Smoking status: Never   Smokeless tobacco:  Never  Vaping Use   Vaping status: Never Used  Substance Use Topics   Alcohol use: No   Drug use: No    Allergies:  Allergies  Allergen Reactions   Other Itching and Rash    ALL NUTS   Peanut Allergen Powder-Dnfp Itching    Medications Prior to Admission  Medication Sig Dispense Refill Last Dose/Taking   insulin  glargine (LANTUS  SOLOSTAR) 100 UNIT/ML Solostar Pen Inject 10 Units into the skin daily. 15 mL 3 01/08/2024 Morning   Prenatal MV-Min-FA-Omega-3 (PRENATAL GUMMIES/DHA & FA) 0.4-32.5 MG CHEW Chew 1 each by mouth daily. 30 tablet 2 01/08/2024 Morning   Accu-Chek Softclix Lancets lancets Use as instructed to check blood sugars bid 200 each 12    Blood Glucose Monitoring Suppl (ACCU-CHEK GUIDE ME) w/Device KIT 1 each by Does not apply route in the morning and at bedtime. 1 kit 0    fluticasone  (FLONASE ) 50 MCG/ACT nasal spray  (Patient not taking: Reported on 12/18/2023)      glucose blood (ACCU-CHEK GUIDE) test strip Use as instructed to check blood sugars bid 200 each 12    terconazole  (TERAZOL 7 ) 0.4 % vaginal cream Place 1 applicator  vaginally at bedtime. Use for seven days 45 g 0     Review of Systems  Constitutional:  Negative for chills and fever.  HENT:  Negative for congestion and sore throat.   Eyes:  Negative for pain and visual disturbance.  Respiratory:  Negative for cough, chest tightness and shortness of breath.   Cardiovascular:  Negative for chest pain.  Gastrointestinal:  Negative for abdominal pain, diarrhea, nausea and vomiting.  Endocrine: Negative for cold intolerance and heat intolerance.  Genitourinary:  Positive for vaginal bleeding. Negative for dysuria and flank pain.  Musculoskeletal:  Negative for back pain.  Skin:  Negative for rash.  Allergic/Immunologic: Negative for food allergies.  Neurological:  Negative for dizziness and light-headedness.  Psychiatric/Behavioral:  Negative for agitation.    Physical Exam   Blood pressure 111/67, pulse  69, temperature 100.1 F (37.8 C), temperature source Oral, resp. rate 17, height 5' 3 (1.6 m), weight 74.6 kg, last menstrual period 10/25/2023, SpO2 100%.  Physical Exam Vitals and nursing note reviewed. Exam conducted with a chaperone present (+ female circumcision with clitoris removal and infindibulation).  Constitutional:      General: She is not in acute distress.    Appearance: She is well-developed.  HENT:     Head: Normocephalic and atraumatic.  Eyes:     General: No scleral icterus.    Conjunctiva/sclera: Conjunctivae normal.  Cardiovascular:     Rate and Rhythm: Normal rate.  Pulmonary:     Effort: Pulmonary effort is normal.  Chest:     Chest wall: No tenderness.  Abdominal:     Palpations: Abdomen is soft.     Tenderness: There is no abdominal tenderness. There is no guarding or rebound.  Genitourinary:    Vagina: Normal.     Cervix: Dilated. Cervical bleeding (Clot and tan tissue actively passing through the cervical os) present.     Uterus: Enlarged.   Musculoskeletal:        General: Normal range of motion.     Cervical back: Normal range of motion and neck supple.  Skin:    General: Skin is warm and dry.     Findings: No rash.  Neurological:     Mental Status: She is alert and oriented to person, place, and time.     MAU Course  Pelvic US   Date/Time: 01/08/2024 6:43 PM  Performed by: Eldonna Suzen Octave, MD Authorized by: Eldonna Suzen Octave, MD   Procedure details:    Indications: evaluate for IUP     Assess:  Fetal viability   Technique:  Transabdominal obstetric (HCG+) exam   Images: archived   Study Limitations: body habitus Uterine findings:    Endometrial stripe: identified     Intrauterine pregnancy: not identified     Single gestation: not identified     Gestational sac: not identified     Yolk sac: not identified     Fetal pole: not identified     Fetal heart rate: not identified        MDM- HIGH - POC US  with retained  POCs even after removal of tissue on exam - Compared to previous images on 9/2 and imaging is definitive for failed pregnancy - Lengthy discussion about options-- [name] #[id number] interpreter used. Discussed expectant management, cytotec  vs MVA. Patient opted for cytotec .  - Also many questions about why this has happened (had miscarriages in 2023, 2024 and now 2025)  Assessment and Plan   1. Incomplete abortion   2. Retained products  of conception after miscarriage    Discharged stable condition Rx for meds sent to pharmacy Reviewed return precautions in detail Plan for outpatient visit in 3-4 weeks   Allergies as of 01/08/2024       Reactions   Other Itching, Rash   ALL NUTS   Peanut Allergen Powder-dnfp Itching        Medication List     STOP taking these medications    terconazole  0.4 % vaginal cream Commonly known as: TERAZOL 7        TAKE these medications    Accu-Chek Guide Me w/Device Kit 1 each by Does not apply route in the morning and at bedtime.   Accu-Chek Guide test strip Generic drug: glucose blood Use as instructed to check blood sugars bid   Accu-Chek Softclix Lancets lancets Use as instructed to check blood sugars bid   fluticasone  50 MCG/ACT nasal spray Commonly known as: FLONASE    ibuprofen  800 MG tablet Commonly known as: ADVIL  Take 1 tablet (800 mg total) by mouth 3 (three) times daily with meals as needed for headache or moderate pain (pain score 4-6).   Lantus  SoloStar 100 UNIT/ML Solostar Pen Generic drug: insulin  glargine Inject 10 Units into the skin daily.   misoprostol  200 MCG tablet Commonly known as: CYTOTEC  Place four tablets in between your gums and cheeks (two tablets on each side) as instructed or you can insert four tablets vaginally if you prefer   ondansetron  4 MG disintegrating tablet Commonly known as: ZOFRAN -ODT Take 1 tablet (4 mg total) by mouth every 6 (six) hours as needed for nausea.   oxycodone  5 MG  capsule Commonly known as: OXY-IR Take 1 capsule (5 mg total) by mouth every 4 (four) hours as needed (breakthrough pain).   Prenatal Gummies/DHA & FA 0.4-32.5 MG Chew Chew 1 each by mouth daily.        Suzen Octave [facility] 01/08/2024, 6:42 PM

## 2024-01-12 LAB — SURGICAL PATHOLOGY

## 2024-01-15 ENCOUNTER — Encounter: Payer: Self-pay | Admitting: Certified Nurse Midwife

## 2024-01-19 ENCOUNTER — Ambulatory Visit: Payer: Self-pay | Admitting: Family Medicine

## 2024-01-21 ENCOUNTER — Encounter: Payer: Self-pay | Admitting: Obstetrics and Gynecology

## 2024-01-21 NOTE — Telephone Encounter (Signed)
 RN attempted to contact pt us  [facility] [phone].  Call immediately went to voicemail, left message with office call back number.    Aviance [place]wood,RN

## 2024-01-21 NOTE — Telephone Encounter (Signed)
-----   Message from Suzen Maryan Masters sent at 01/19/2024 11:29 AM EDT ----- Patient diagnosed with miscarriage in [facility] with some retained POCs. Given cytotec . Can you call to check in on her bleeding?  Dr Masters ----- Message ----- From: Interface, Lab In Three Zero One Sent: 01/12/2024   3:50 PM EDT To: Suzen Maryan Masters, MD

## 2024-01-22 ENCOUNTER — Encounter: Payer: Self-pay | Admitting: Nurse Practitioner

## 2024-01-22 ENCOUNTER — Ambulatory Visit (INDEPENDENT_AMBULATORY_CARE_PROVIDER_SITE_OTHER): Payer: Self-pay | Admitting: Nurse Practitioner

## 2024-01-22 ENCOUNTER — Other Ambulatory Visit: Payer: Self-pay | Admitting: Nurse Practitioner

## 2024-01-22 VITALS — BP 108/62 | HR 66 | Temp 97.9°F | Wt 161.0 lb

## 2024-01-22 DIAGNOSIS — O034 Incomplete spontaneous abortion without complication: Secondary | ICD-10-CM | POA: Insufficient documentation

## 2024-01-22 DIAGNOSIS — E1165 Type 2 diabetes mellitus with hyperglycemia: Secondary | ICD-10-CM

## 2024-01-22 DIAGNOSIS — N96 Recurrent pregnancy loss: Secondary | ICD-10-CM | POA: Insufficient documentation

## 2024-01-22 MED ORDER — GLIPIZIDE ER 5 MG PO TB24: 5.0000 mg | ORAL_TABLET | Freq: Every day | ORAL | 1 refills | Status: DC

## 2024-01-22 NOTE — Progress Notes (Signed)
 Established Patient Office Visit  Subjective:  Patient ID: Mariah Joyce, female    DOB: 06/08/1980  Age: 43 y.o. MRN: [id number]  CC:  Chief Complaint  Patient presents with   Diabetes    HPI    Discussed the use of AI scribe software for clinical note transcription with the patient, who gave verbal consent to proceed.  History of Present Illness Mariah Joyce is a 43 year old female  has a past medical history of Allergic rhinitis (06/26/2023), Complication of anesthesia, Dyslipidemia, goal LDL below 70 (06/26/2023), Exposure to sexually transmitted disease (STD) (09/26/2017), GDM (gestational diabetes mellitus), Hyperlipidemia, Infertility, female (02/27/2023), Mass of right breast (02/27/2023), Menstrual migraine with status migrainosus, not intractable (11/24/2022), Obesity (03/10/2014), Patient desires pregnancy (09/26/2017), Preterm premature rupture of membranes in third trimester (10/01/2018), Type 2 diabetes mellitus (HCC), and Vaginal pruritus (06/29/2016).  who presents for follow-up after a miscarriage.  Recently had miscarriage and has completed her course of Cytotec .,she is no longer experiencing any bleeding or stomach pain following the miscarriage. No vomiting or other concerning symptoms are present at this time.  She is currently taking Lantus  at a dose of ten units daily, she would like to restart glipizide . Her blood sugar levels have been stable at home.  She was referred to endocrinologist when pregnant, She is interested in continuing to see an endocrinologist despite the recent miscarriage and is awaiting a call from her office to schedule an appointment.  Interpretation services provided by medical interpreter  Assessment & Plan     Past Medical History:  Diagnosis Date   Allergic rhinitis 06/26/2023   Complication of anesthesia    Dyslipidemia, goal LDL below 70 06/26/2023   Exposure to sexually transmitted disease (STD)  09/26/2017   GDM (gestational diabetes mellitus)    Hyperlipidemia    Infertility, female 02/27/2023   Mass of right breast 02/27/2023   Menstrual migraine with status migrainosus, not intractable 11/24/2022   Obesity 03/10/2014   Patient desires pregnancy 09/26/2017   Preterm premature rupture of membranes in third trimester 10/01/2018   Type 2 diabetes mellitus (HCC)    Vaginal pruritus 06/29/2016    Past Surgical History:  Procedure Laterality Date   CESAREAN SECTION     for non-reassuring fhr    Family History  Problem Relation Age of Onset   Other Mother        hadn't been sick, just got fever and died.? cause   Hypertension Father    Diabetes Father    Asthma Neg Hx    Eczema Neg Hx    Environmental Allergies Neg Hx     Social History   Socioeconomic History   Marital status: Married    Spouse name: Not on file   Number of children: 4   Years of education: Not on file   Highest education level: Not on file  Occupational History   Not on file  Tobacco Use   Smoking status: Never   Smokeless tobacco: Never  Vaping Use   Vaping status: Never Used  Substance and Sexual Activity   Alcohol use: No   Drug use: No   Sexual activity: Yes    Birth control/protection: None  Other Topics Concern   Not on file  Social History Narrative   Lives with her husband and children    Social Drivers of Health   Financial Resource Strain: Not on file  Food Insecurity: No Food Insecurity (12/12/2022)   Hunger Vital Sign  Worried About Programme researcher, broadcasting/film/video in the Last Year: Never true    Ran Out of Food in the Last Year: Never true  Transportation Needs: No Transportation Needs (12/12/2022)   PRAPARE - Administrator, Civil Service (Medical): No    Lack of Transportation (Non-Medical): No  Physical Activity: Not on file  Stress: Not on file  Social Connections: Not on file  Intimate Partner Violence: Not on file    Outpatient Medications Prior to Visit   Medication Sig Dispense Refill   Accu-Chek Softclix Lancets lancets Use as instructed to check blood sugars bid 200 each 12   Blood Glucose Monitoring Suppl (ACCU-CHEK GUIDE ME) w/Device KIT 1 each by Does not apply route in the morning and at bedtime. 1 kit 0   glucose blood (ACCU-CHEK GUIDE) test strip Use as instructed to check blood sugars bid 200 each 12   ibuprofen  (ADVIL ) 800 MG tablet Take 1 tablet (800 mg total) by mouth 3 (three) times daily with meals as needed for headache or moderate pain (pain score 4-6). 30 tablet 3   Prenatal MV-Min-FA-Omega-3 (PRENATAL GUMMIES/DHA & FA) 0.4-32.5 MG CHEW Chew 1 each by mouth daily. 30 tablet 2   insulin  glargine (LANTUS  SOLOSTAR) 100 UNIT/ML Solostar Pen Inject 10 Units into the skin daily. 15 mL 3   fluticasone  (FLONASE ) 50 MCG/ACT nasal spray  (Patient not taking: Reported on 12/18/2023)     misoprostol  (CYTOTEC ) 200 MCG tablet Place four tablets in between your gums and cheeks (two tablets on each side) as instructed or you can insert four tablets vaginally if you prefer (Patient not taking: Reported on 01/22/2024) 4 tablet 1   ondansetron  (ZOFRAN -ODT) 4 MG disintegrating tablet Take 1 tablet (4 mg total) by mouth every 6 (six) hours as needed for nausea. (Patient not taking: Reported on 01/22/2024) 20 tablet 0   oxycodone  (OXY-IR) 5 MG capsule Take 1 capsule (5 mg total) by mouth every 4 (four) hours as needed (breakthrough pain). (Patient not taking: Reported on 01/22/2024) 3 capsule 0   No facility-administered medications prior to visit.    Allergies  Allergen Reactions   Other Itching and Rash    ALL NUTS   Peanut Allergen Powder-Dnfp Itching    ROS Review of Systems  Constitutional:  Negative for appetite change, chills, fatigue and fever.  HENT:  Negative for congestion, postnasal drip, rhinorrhea and sneezing.   Respiratory:  Negative for cough, shortness of breath and wheezing.   Cardiovascular:  Negative for chest pain,  palpitations and leg swelling.  Gastrointestinal:  Negative for abdominal pain, constipation, nausea and vomiting.  Genitourinary:  Negative for difficulty urinating, dysuria, flank pain and frequency.  Musculoskeletal:  Negative for arthralgias, back pain, joint swelling and myalgias.  Skin:  Negative for color change, pallor, rash and wound.  Neurological:  Negative for dizziness, facial asymmetry, weakness, numbness and headaches.  Psychiatric/Behavioral:  Negative for behavioral problems, confusion, self-injury and suicidal ideas.       Objective:    Physical Exam Vitals and nursing note reviewed.  Constitutional:      General: She is not in acute distress.    Appearance: Normal appearance. She is not ill-appearing, toxic-appearing or diaphoretic.  HENT:     Mouth/Throat:     Pharynx: No posterior oropharyngeal erythema.  Eyes:     General: No scleral icterus.       Right eye: No discharge.        Left eye: No discharge.  Extraocular Movements: Extraocular movements intact.     Conjunctiva/sclera: Conjunctivae normal.  Cardiovascular:     Rate and Rhythm: Normal rate and regular rhythm.     Pulses: Normal pulses.     Heart sounds: Normal heart sounds. No murmur heard.    No friction rub. No gallop.  Pulmonary:     Effort: Pulmonary effort is normal. No respiratory distress.     Breath sounds: Normal breath sounds. No stridor. No wheezing, rhonchi or rales.  Chest:     Chest wall: No tenderness.  Abdominal:     General: There is no distension.     Palpations: Abdomen is soft.     Tenderness: There is no abdominal tenderness. There is no right CVA tenderness, left CVA tenderness or guarding.  Musculoskeletal:        General: No swelling, tenderness, deformity or signs of injury.     Right lower leg: No edema.     Left lower leg: No edema.  Skin:    General: Skin is warm and dry.     Capillary Refill: Capillary refill takes less than 2 seconds.     Coloration: Skin  is not jaundiced or pale.     Findings: No bruising, erythema or lesion.  Neurological:     Mental Status: She is alert and oriented to person, place, and time.     Motor: No weakness.     Coordination: Coordination normal.     Gait: Gait normal.  Psychiatric:        Mood and Affect: Mood normal.        Behavior: Behavior normal.        Thought Content: Thought content normal.        Judgment: Judgment normal.     BP 108/62   Pulse 66   Temp 97.9 F (36.6 C)   Wt 161 lb (73 kg)   LMP 10/25/2023   SpO2 99%   Breastfeeding No   BMI 28.52 kg/m  Wt Readings from Last 3 Encounters:  01/22/24 161 lb (73 kg)  01/08/24 164 lb 8 oz (74.6 kg)  12/18/23 160 lb (72.6 kg)    Lab Results  Component Value Date   TSH 0.988 09/26/2017   Lab Results  Component Value Date   WBC 4.2 12/18/2023   HGB 13.5 12/18/2023   HCT 42.6 12/18/2023   MCV 93 12/18/2023   PLT 167 12/18/2023   Lab Results  Component Value Date   NA 136 12/18/2023   K 3.7 12/18/2023   CO2 21 12/18/2023   GLUCOSE 131 (H) 12/18/2023   BUN 9 12/18/2023   CREATININE 0.66 12/18/2023   BILITOT 0.5 12/18/2023   ALKPHOS 48 12/18/2023   AST 12 12/18/2023   ALT 10 12/18/2023   PROT 6.5 12/18/2023   ALBUMIN 4.1 12/18/2023   CALCIUM  9.1 12/18/2023   ANIONGAP 8 06/16/2016   EGFR 113 12/18/2023   Lab Results  Component Value Date   CHOL 161 12/18/2023   Lab Results  Component Value Date   HDL 55 12/18/2023   Lab Results  Component Value Date   LDLCALC 86 12/18/2023   Lab Results  Component Value Date   TRIG 113 12/18/2023   Lab Results  Component Value Date   CHOLHDL 2.9 12/18/2023   Lab Results  Component Value Date   HGBA1C 7.0 (A) 12/18/2023      Assessment & Plan:   Problem List Items Addressed This Visit  Endocrine   Uncontrolled type 2 diabetes mellitus with hyperglycemia (HCC)   Lab Results  Component Value Date   HGBA1C 7.0 (A) 12/18/2023   Type 2 diabetes mellitus  previously managed with Lantus  insulin . She prefers oral medication. She will continue with the endocrinologist appointment. - Refill glipizide  5 mg oral daily. - Provide endocrinologist's office number for follow-up if desired      Relevant Medications   glipiZIDE  (GLUCOTROL  XL) 5 MG 24 hr tablet     Other   Incomplete abortion - Primary   Recent miscarriage Recent miscarriage with no current complications. - Advise to keep the gynecology appointment on October 6th for follow-up.  .        Other Visit Diagnoses       Controlled type 2 diabetes mellitus with hyperglycemia, without long-term current use of insulin  (HCC)       Relevant Medications   glipiZIDE  (GLUCOTROL  XL) 5 MG 24 hr tablet       Meds ordered this encounter  Medications   glipiZIDE  (GLUCOTROL  XL) 5 MG 24 hr tablet    Sig: Take 1 tablet (5 mg total) by mouth daily.    Dispense:  90 tablet    Refill:  1    Follow-up: Return in about 3 months (around 04/22/2024) for DM.    Aliea Bobe R Tremain Rucinski, FNP

## 2024-01-22 NOTE — Assessment & Plan Note (Signed)
 Recent miscarriage Recent miscarriage with no current complications. - Advise to keep the gynecology appointment on October 6th for follow-up.  SABRA

## 2024-01-22 NOTE — Patient Instructions (Signed)
 1. Controlled type 2 diabetes mellitus with hyperglycemia, without long-term current use of insulin  (HCC)  - glipiZIDE  (GLUCOTROL  XL) 5 MG 24 hr tablet; Take 1 tablet (5 mg total) by mouth daily.  Dispense: 90 tablet; Refill: 1    It is important that you exercise regularly at least 30 minutes 5 times a week as tolerated  Think about what you will eat, plan ahead. Choose  clean, green, fresh or frozen over canned, processed or packaged foods which are more sugary, salty and fatty. 70 to 75% of food eaten should be vegetables and fruit. Three meals at set times with snacks allowed between meals, but they must be fruit or vegetables. Aim to eat over a 12 hour period , example 7 am to 7 pm, and STOP after  your last meal of the day. Drink water,generally about 64 ounces per day, no other drink is as healthy. Fruit juice is best enjoyed in a healthy way, by EATING the fruit.  Thanks for choosing [facility] we consider it a privelige to serve you.

## 2024-01-22 NOTE — Telephone Encounter (Signed)
 There is a referral dated 12/18/23 but the TO section is blank, the diagnosis is DM2. Would this have been meant for Endo? Thanks!

## 2024-01-22 NOTE — Assessment & Plan Note (Signed)
 Lab Results  Component Value Date   HGBA1C 7.0 (A) 12/18/2023   Type 2 diabetes mellitus previously managed with Lantus  insulin . She prefers oral medication. She will continue with the endocrinologist appointment. - Refill glipizide  5 mg oral daily. - Provide endocrinologist's office number for follow-up if desired

## 2024-01-28 ENCOUNTER — Other Ambulatory Visit: Payer: Self-pay

## 2024-01-28 ENCOUNTER — Encounter: Payer: Self-pay | Admitting: Family Medicine

## 2024-01-28 ENCOUNTER — Ambulatory Visit: Admitting: Family Medicine

## 2024-01-28 VITALS — BP 134/83 | HR 67 | Wt 161.5 lb

## 2024-01-28 DIAGNOSIS — N96 Recurrent pregnancy loss: Secondary | ICD-10-CM | POA: Diagnosis not present

## 2024-01-28 DIAGNOSIS — Z603 Acculturation difficulty: Secondary | ICD-10-CM

## 2024-01-28 DIAGNOSIS — Z758 Other problems related to medical facilities and other health care: Secondary | ICD-10-CM

## 2024-01-28 DIAGNOSIS — E1165 Type 2 diabetes mellitus with hyperglycemia: Secondary | ICD-10-CM | POA: Diagnosis not present

## 2024-01-28 NOTE — Assessment & Plan Note (Signed)
 Consider change to insulin  Continue excellent control.

## 2024-01-28 NOTE — Assessment & Plan Note (Signed)
 Discussed age and T2DM. Recommend insulin  for pregnancy. A1C < 6.5. Consider Anora testing if miscarriage again. Begin Prenatal vitamins

## 2024-01-28 NOTE — Patient Instructions (Addendum)
 Following an appropriate diet and keeping your blood sugar under control is the most important thing to do for your health and that of  any future pregnancy.  Please check your blood sugar 4 times daily.  Goals for Blood sugar should be: 1. Fasting (first thing in the morning before eating) should be less than 90.   2. 2 hours after meals should be less than 120. 3. Consider change to insulin  for pregnancy 4. Consider starting prenatal vitamins  Please eat 3 meals and 3 snacks.  Include protein (meat, dairy-cheese, eggs, nuts) with all meals.  Be mindful that carbohydrates increase your blood sugar.  Not just sweet food (cookies, cake, donuts, fruit, juice, soda) but also bread, pasta, rice, and potatoes.  You have to limit how many carbs you are eating.  Adding exercise, as little as 30 minutes a day can decrease your blood sugar. ????? ???? ????? ????? ??????? ??? ????? ??? ???? ??? ??????? ?? ??? ?? ??? ???? ????? ???? ?? ??? ???????.  ???? ??? ????? ??? ???? 4 ???? ??????.  ??? ?? ???? ?????? ?????? ??? ???? ??? ???: 1. ??? ?? ???? ????? ??? ???? ??? ?? 90 (???? ?????? ??? ?????). 2. ??? ?? ???? ????? ??? ???? ??? ?? 120 (??? ?????? ?? ????? ??????). 3. ??????? ?? ??????? ????????? ????? ?????. 4. ??????? ?? ????? ?????? ????????? ?? ??? ???????.  ???? ????? 3 ????? ?????? ?3 ????? ?????. ??? ???????? (??????? ??????? ??????? ??????? ??????? ?????????) ??? ???? ???????.  ????? ?? ???????????? ???? ????? ??? ????. ??? ??? ??????? ?????? (????????? ?????? ???????? ???????? ??????? ????????? ???????)? ???? ????? ?????? ??????????? ??????? ????????. ??? ????? ???? ?? ???? ???????????? ???? ??????????.  ?????? ???????? ??? ???? 30 ????? ??????? ???? ?? ???? ????? ??? ????. aitibae nizam ghidhayiyin munasib walhifaz ealaa mustawaa sukar aldam taht alsaytarat hu 'ahamu ma yajib fieluh lisihatik wasihat 'ayi haml mustaqbali. yurjaa fahs mustawaa sukar aldam 4 maraat ywmyan. yajib 'an  takun 'ahdafuk limustawaa sukar aldam kama yali: 1. yajib 'an yakun mustawaa sukar aldam 'aqala min 90 (syam alsabah qabl al'akli). 2. yajib 'an [place] sukar aldam 'aqala min 120 (baed saeatayn min tanawul altaeami). 3. altafkir fi astikhdam al'ansulin 'athna' alhamla. 4. altafkir fi albad' bitanawul fitaminat ma qabl alwiladati. yurjaa tanawul 3 wajabat rayiysiat wa3 wajabat khafifatun. 'adf alburutin (alluhuma, wamuntajat al'alban waljuban, walbayda, walmukasarati) 'iilaa jamie alwajabati. tadhakuri 'ana alkarbuhidrat tarfae mustawaa sukar aldum. lays faqat al'ateimat alhulwa (albaskuiti, alkik, alduwnatu, alfakihatu, aleusira, almashrubat alghaziati), walakin aydan alkhubzi, walmaekarunata, wal'arza, walbatatisi. yajib elyk alhadu min kamiyat alkarbuhidrat alati tatanawalinha. mumarasat alriyadati, walaw limudat 30 daqiqat ywmyan, yumkin 'an tukhafid mustawaa sukar aldam.

## 2024-01-28 NOTE — Progress Notes (Signed)
   Subjective:    Patient ID: Mariah Joyce is a 43 y.o. female presenting with SAB Follow Up  on 01/28/2024  HPI: S/p SAB x 3 now. Has T2DM x 1 year. Most recent A1C is 7.   Review of Systems  Constitutional:  Negative for chills and fever.  Respiratory:  Negative for shortness of breath.   Cardiovascular:  Negative for chest pain.  Gastrointestinal:  Negative for abdominal pain, nausea and vomiting.  Genitourinary:  Negative for dysuria.  Skin:  Negative for rash.      Objective:    BP 134/83   Pulse 67   Wt 161 lb 8 oz (73.3 kg)   LMP 10/25/2023   Breastfeeding No   BMI 28.61 kg/m  Physical Exam Exam conducted with a chaperone present.  Constitutional:      General: She is not in acute distress.    Appearance: She is well-developed.  HENT:     Head: Normocephalic and atraumatic.  Eyes:     General: No scleral icterus. Cardiovascular:     Rate and Rhythm: Normal rate.  Pulmonary:     Effort: Pulmonary effort is normal.  Abdominal:     Palpations: Abdomen is soft.  Musculoskeletal:     Cervical back: Neck supple.  Skin:    General: Skin is warm and dry.  Neurological:     Mental Status: She is alert and oriented to person, place, and time.         Assessment & Plan:   Problem List Items Addressed This Visit       Unprioritized   Language barrier   Arabic interpreter: [name] used       Uncontrolled type 2 diabetes mellitus with hyperglycemia (HCC) - Primary   Consider change to insulin  Continue excellent control.      History of recurrent abortion, not currently pregnant   Discussed age and T2DM. Recommend insulin  for pregnancy. A1C < 6.5. Consider Anora testing if miscarriage again. Begin Prenatal vitamins        No follow-ups on file.  Glenys GORMAN Birk, MD 01/28/2024 9:39 AM

## 2024-01-28 NOTE — Assessment & Plan Note (Signed)
 Arabic interpreter: [name] used

## 2024-04-16 ENCOUNTER — Encounter: Payer: Self-pay | Admitting: Nurse Practitioner

## 2024-04-16 ENCOUNTER — Ambulatory Visit (INDEPENDENT_AMBULATORY_CARE_PROVIDER_SITE_OTHER): Payer: Self-pay | Admitting: Nurse Practitioner

## 2024-04-16 VITALS — BP 111/78 | HR 71 | Wt 166.0 lb

## 2024-04-16 DIAGNOSIS — E1165 Type 2 diabetes mellitus with hyperglycemia: Secondary | ICD-10-CM

## 2024-04-16 DIAGNOSIS — R102 Pelvic and perineal pain unspecified side: Secondary | ICD-10-CM | POA: Insufficient documentation

## 2024-04-16 DIAGNOSIS — G43829 Menstrual migraine, not intractable, without status migrainosus: Secondary | ICD-10-CM | POA: Diagnosis not present

## 2024-04-16 LAB — POCT GLYCOSYLATED HEMOGLOBIN (HGB A1C): Hemoglobin A1C: 7 % — AB (ref 4.0–5.6)

## 2024-04-16 MED ORDER — SUMATRIPTAN SUCCINATE 50 MG PO TABS: 50.0000 mg | ORAL_TABLET | Freq: Every day | ORAL | 0 refills | Status: AC | PRN

## 2024-04-16 MED ORDER — SUMATRIPTAN SUCCINATE 50 MG PO TABS: 50.0000 mg | ORAL_TABLET | Freq: Every day | ORAL | 3 refills | Status: DC | PRN

## 2024-04-16 MED ORDER — GLIPIZIDE ER 10 MG PO TB24: 10.0000 mg | ORAL_TABLET | Freq: Every day | ORAL | 1 refills | Status: AC

## 2024-04-16 NOTE — Patient Instructions (Addendum)
 Goal for fasting blood sugar ranges from 80 to 120 and 2 hours after any meal or at bedtime should be between 130 to 170.     1. Uncontrolled type 2 diabetes mellitus with hyperglycemia (HCC) (Primary) - HgB A1c - glipiZIDE  (GLUCOTROL  XL) 10 MG 24 hr tablet; Take 1 tablet (10 mg total) by mouth daily with breakfast.  Dispense: 90 tablet; Refill: 1  2. Pelvic pressure in female - US  PELVIC COMPLETE WITH TRANSVAGINAL; Future  3. Controlled type 2 diabetes mellitus with hyperglycemia, without long-term current use of insulin  (HCC)  4. Menstrual migraine without status migrainosus, not intractable - SUMAtriptan  (IMITREX ) 50 MG tablet; Take 1 tablet (50 mg total) by mouth daily as needed for migraine. May repeat in 1-2 hours if headache persists or recurs.  Dispense: 10 tablet; Refill: 3    It is important that you exercise regularly at least 30 minutes 5 times a week as tolerated  Think about what you will eat, plan ahead. Choose  clean, green, fresh or frozen over canned, processed or packaged foods which are more sugary, salty and fatty. 70 to 75% of food eaten should be vegetables and fruit. Three meals at set times with snacks allowed between meals, but they must be fruit or vegetables. Aim to eat over a 12 hour period , example 7 am to 7 pm, and STOP after  your last meal of the day. Drink water,generally about 64 ounces per day, no other drink is as healthy. Fruit juice is best enjoyed in a healthy way, by EATING the fruit.  Thanks for choosing [facility] we consider it a privelige to serve you.

## 2024-04-16 NOTE — Assessment & Plan Note (Signed)
 Lab Results  Component Value Date   HGBA1C 7.0 (A) 04/16/2024   Type 2 diabetes mellitus with hyperglycemia A1c is 7.0, above target. Reports high morning blood sugars. - Increased glipizide  to 10 mgt daily with breakfast - Advised to monitor blood sugar if symptoms of hypoglycemia occur. - Encouraged dietary modifications to reduce carbohydrate intake, including avoiding bread, pasta, and rice sugar sweets , soda - Recommended moderate to vigorous exercise for 30 minutes, five days a week. General health maintenance Has not seen an eye doctor in the past 12 months. Recommended annual diabetic eye exam. - Referred to ophthalmologist for diabetic eye exam at [facility]

## 2024-04-16 NOTE — Assessment & Plan Note (Signed)
 Pelvic heaviness after menses, rule out uterine fibroids Reports heaviness in lower abdomen after menses. Differential includes uterine fibroids. - Ordered pelvic ultrasound to evaluate for uterine fibroids.

## 2024-04-16 NOTE — Assessment & Plan Note (Signed)
" °  Severe headaches during menstruation, not relieved by ibuprofen . - Prescribed Imitrex  for migraine, to be taken at onset of headache and repeated in 1-2 hours if needed., not more than 2 pills a week  "

## 2024-04-16 NOTE — Progress Notes (Signed)
 "  Established Patient Office Visit  Subjective:  Patient ID: Mariah Joyce, female    DOB: [birth year]  Age: 43 y.o. MRN: [id number]  CC:  Chief Complaint  Patient presents with   Diabetes   Headache    Becoming bad, comes with the cycle and lasts 3-4 days, taking OTC pain medication has been ineffective    GI Problem    Feels a heaviness after having cycle, lasts about 10 days after and cycles last only 4 days, Cycles are not heavy at all    HPI    Discussed the use of AI scribe software for clinical note transcription with the patient, who gave verbal consent to proceed.  History of Present Illness Militza Dorena Dorfman is a 43 year old female with diabetes who presents for follow-up on her diabetes management. Medical interpreter utilized today   She is currently taking glipizide  5 mg once daily. Her A1c is 7.0. She experiences occasional hypoglycemic symptoms such as hunger, sweating, and increased heart rate. She monitors her blood sugar levels, especially when they drop below 70 mg/dL.  She finds it challenging to avoid bread in her diet but is working on switching to whole wheat bread and controlling portion sizes.  She engages in regular physical activity by walking in the park and is working towards engaging in moderate to vigorous exercise for 30 minutes, five days a week.  She experiences severe headaches during her menstrual cycle, lasting three to four days, which do not resolve with ibuprofen . This is a new symptom for her. Her menstrual cycle is normal to light, lasting four days. After her period, she experiences heaviness in her lower body, described as feeling 'paralyzed,' which lasts for about a week to ten days.  She had a miscarriage three months ago. She is not actively trying to conceive but is not using birth control. She associates her high morning blood sugar levels with her diabetes management challenges.  She has not seen an eye doctor in  the past twelve months for a diabetic eye exam.    Assessment & Plan         Past Medical History:  Diagnosis Date   Allergic rhinitis 06/26/2023   Complication of anesthesia    Dyslipidemia, goal LDL below 70 06/26/2023   Exposure to sexually transmitted disease (STD) 09/26/2017   GDM (gestational diabetes mellitus)    Hyperlipidemia    Infertility, female 02/27/2023   Mass of right breast 02/27/2023   Menstrual migraine with status migrainosus, not intractable 11/24/2022   Obesity 03/10/2014   Patient desires pregnancy 09/26/2017   Preterm premature rupture of membranes in third trimester 10/01/2018   Type 2 diabetes mellitus (HCC)    Vaginal pruritus 06/29/2016    Past Surgical History:  Procedure Laterality Date   CESAREAN SECTION     for non-reassuring fhr    Family History  Problem Relation Age of Onset   Other Mother        hadn't been sick, just got fever and died.? cause   Hypertension Father    Diabetes Father    Asthma Neg Hx    Eczema Neg Hx    Environmental Allergies Neg Hx     Social History   Socioeconomic History   Marital status: Married    Spouse name: Not on file   Number of children: 4   Years of education: Not on file   Highest education level: Not on file  Occupational History   Not  on file  Tobacco Use   Smoking status: Never   Smokeless tobacco: Never  Vaping Use   Vaping status: Never Used  Substance and Sexual Activity   Alcohol use: No   Drug use: No   Sexual activity: Yes    Birth control/protection: None  Other Topics Concern   Not on file  Social History Narrative   Lives with her husband and children    Social Drivers of Health   Tobacco Use: Low Risk (01/28/2024)   Patient History    Smoking Tobacco Use: Never    Smokeless Tobacco Use: Never    Passive Exposure: Not on file  Financial Resource Strain: Not on file  Food Insecurity: No Food Insecurity (01/28/2024)   Epic    Worried About Programme Researcher, Broadcasting/film/video  in the Last Year: Never true    Ran Out of Food in the Last Year: Never true  Transportation Needs: No Transportation Needs (01/28/2024)   Epic    Lack of Transportation (Medical): No    Lack of Transportation (Non-Medical): No  Physical Activity: Not on file  Stress: Not on file  Social Connections: Not on file  Intimate Partner Violence: Not on file  Depression (PHQ2-9): Low Risk (01/28/2024)   Depression (PHQ2-9)    PHQ-2 Score: 0  Alcohol Screen: Not on file  Housing: Not on file  Utilities: Not on file  Health Literacy: Not on file    Outpatient Medications Prior to Visit  Medication Sig Dispense Refill   Accu-Chek Softclix Lancets lancets Use as instructed to check blood sugars bid 200 each 12   Blood Glucose Monitoring Suppl (ACCU-CHEK GUIDE ME) w/Device KIT 1 each by Does not apply route in the morning and at bedtime. 1 kit 0   glucose blood (ACCU-CHEK GUIDE) test strip Use as instructed to check blood sugars bid 200 each 12   ibuprofen  (ADVIL ) 800 MG tablet Take 1 tablet (800 mg total) by mouth 3 (three) times daily with meals as needed for headache or moderate pain (pain score 4-6). 30 tablet 3   Prenatal MV-Min-FA-Omega-3 (PRENATAL GUMMIES/DHA & FA) 0.4-32.5 MG CHEW Chew 1 each by mouth daily. 30 tablet 2   glipiZIDE  (GLUCOTROL  XL) 5 MG 24 hr tablet Take 1 tablet (5 mg total) by mouth daily. 90 tablet 1   fluticasone  (FLONASE ) 50 MCG/ACT nasal spray  (Patient not taking: Reported on 04/16/2024)     misoprostol  (CYTOTEC ) 200 MCG tablet Place four tablets in between your gums and cheeks (two tablets on each side) as instructed or you can insert four tablets vaginally if you prefer (Patient not taking: Reported on 04/16/2024) 4 tablet 1   ondansetron  (ZOFRAN -ODT) 4 MG disintegrating tablet Take 1 tablet (4 mg total) by mouth every 6 (six) hours as needed for nausea. (Patient not taking: Reported on 04/16/2024) 20 tablet 0   oxycodone  (OXY-IR) 5 MG capsule Take 1 capsule (5 mg  total) by mouth every 4 (four) hours as needed (breakthrough pain). (Patient not taking: Reported on 04/16/2024) 3 capsule 0   No facility-administered medications prior to visit.    Allergies[1]  ROS Review of Systems  Constitutional:  Negative for appetite change, chills, fatigue and fever.  HENT:  Negative for congestion, postnasal drip, rhinorrhea and sneezing.   Respiratory:  Negative for cough, shortness of breath and wheezing.   Cardiovascular:  Negative for chest pain, palpitations and leg swelling.  Gastrointestinal:  Negative for abdominal pain, constipation, nausea and vomiting.  Genitourinary:  Negative  for difficulty urinating, dysuria, flank pain and frequency.  Musculoskeletal:  Negative for arthralgias, back pain, joint swelling and myalgias.  Skin:  Negative for color change, pallor, rash and wound.  Neurological:  Negative for dizziness, facial asymmetry, weakness, numbness and headaches.  Psychiatric/Behavioral:  Negative for behavioral problems, confusion, self-injury and suicidal ideas.       Objective:    Physical Exam Vitals and nursing note reviewed.  Constitutional:      General: She is not in acute distress.    Appearance: Normal appearance. She is obese. She is not ill-appearing, toxic-appearing or diaphoretic.  HENT:     Mouth/Throat:     Mouth: Mucous membranes are moist.     Pharynx: Oropharynx is clear. No oropharyngeal exudate or posterior oropharyngeal erythema.  Eyes:     General: No scleral icterus.       Right eye: No discharge.        Left eye: No discharge.     Extraocular Movements: Extraocular movements intact.     Conjunctiva/sclera: Conjunctivae normal.  Cardiovascular:     Rate and Rhythm: Normal rate and regular rhythm.     Pulses: Normal pulses.     Heart sounds: Normal heart sounds. No murmur heard.    No friction rub. No gallop.  Pulmonary:     Effort: Pulmonary effort is normal. No respiratory distress.     Breath sounds:  Normal breath sounds. No stridor. No wheezing, rhonchi or rales.  Chest:     Chest wall: No tenderness.  Abdominal:     General: There is no distension.     Palpations: Abdomen is soft.     Tenderness: There is no abdominal tenderness. There is no right CVA tenderness, left CVA tenderness or guarding.  Musculoskeletal:        General: No swelling, tenderness, deformity or signs of injury.     Right lower leg: No edema.     Left lower leg: No edema.  Skin:    General: Skin is warm and dry.     Capillary Refill: Capillary refill takes less than 2 seconds.     Coloration: Skin is not jaundiced or pale.     Findings: No bruising, erythema or lesion.  Neurological:     Mental Status: She is alert and oriented to person, place, and time.     Motor: No weakness.     Coordination: Coordination normal.     Gait: Gait normal.  Psychiatric:        Mood and Affect: Mood normal.        Behavior: Behavior normal.        Thought Content: Thought content normal.        Judgment: Judgment normal.     BP 111/78 (BP Location: Right Arm, Patient Position: Sitting, Cuff Size: Normal)   Pulse 71   Wt 166 lb (75.3 kg)   SpO2 100%   Breastfeeding No   BMI 29.41 kg/m  Wt Readings from Last 3 Encounters:  04/16/24 166 lb (75.3 kg)  01/28/24 161 lb 8 oz (73.3 kg)  01/22/24 161 lb (73 kg)    Lab Results  Component Value Date   TSH 0.988 09/26/2017   Lab Results  Component Value Date   WBC 4.2 12/18/2023   HGB 13.5 12/18/2023   HCT 42.6 12/18/2023   MCV 93 12/18/2023   PLT 167 12/18/2023   Lab Results  Component Value Date   NA 136 12/18/2023   K 3.7 12/18/2023  CO2 21 12/18/2023   GLUCOSE 131 (H) 12/18/2023   BUN 9 12/18/2023   CREATININE 0.66 12/18/2023   BILITOT 0.5 12/18/2023   ALKPHOS 48 12/18/2023   AST 12 12/18/2023   ALT 10 12/18/2023   PROT 6.5 12/18/2023   ALBUMIN 4.1 12/18/2023   CALCIUM  9.1 12/18/2023   ANIONGAP 8 06/16/2016   EGFR 113 12/18/2023   Lab Results   Component Value Date   CHOL 161 12/18/2023   Lab Results  Component Value Date   HDL 55 12/18/2023   Lab Results  Component Value Date   LDLCALC 86 12/18/2023   Lab Results  Component Value Date   TRIG 113 12/18/2023   Lab Results  Component Value Date   CHOLHDL 2.9 12/18/2023   Lab Results  Component Value Date   HGBA1C 7.0 (A) 04/16/2024      Assessment & Plan:   Problem List Items Addressed This Visit       Cardiovascular and Mediastinum   Menstrual migraine without status migrainosus, not intractable    Severe headaches during menstruation, not relieved by ibuprofen . - Prescribed Imitrex  for migraine, to be taken at onset of headache and repeated in 1-2 hours if needed., not more than 2 pills a week       Relevant Medications   SUMAtriptan  (IMITREX ) 50 MG tablet     Endocrine   Uncontrolled type 2 diabetes mellitus with hyperglycemia (HCC) - Primary   Lab Results  Component Value Date   HGBA1C 7.0 (A) 04/16/2024   Type 2 diabetes mellitus with hyperglycemia A1c is 7.0, above target. Reports high morning blood sugars. - Increased glipizide  to 10 mgt daily with breakfast - Advised to monitor blood sugar if symptoms of hypoglycemia occur. - Encouraged dietary modifications to reduce carbohydrate intake, including avoiding bread, pasta, and rice sugar sweets , soda - Recommended moderate to vigorous exercise for 30 minutes, five days a week. General health maintenance Has not seen an eye doctor in the past 12 months. Recommended annual diabetic eye exam. - Referred to ophthalmologist for diabetic eye exam at [facility]        Relevant Medications   glipiZIDE  (GLUCOTROL  XL) 10 MG 24 hr tablet   Other Relevant Orders   HgB A1c (Completed)     Other   Pelvic pressure in female   Pelvic heaviness after menses, rule out uterine fibroids Reports heaviness in lower abdomen after menses. Differential includes uterine fibroids. - Ordered pelvic ultrasound to  evaluate for uterine fibroids.      Relevant Orders   US  PELVIC COMPLETE WITH TRANSVAGINAL    Meds ordered this encounter  Medications   DISCONTD: SUMAtriptan  (IMITREX ) 50 MG tablet    Sig: Take 1 tablet (50 mg total) by mouth daily as needed for migraine. May repeat in 1-2 hours if headache persists or recurs.    Dispense:  10 tablet    Refill:  3   glipiZIDE  (GLUCOTROL  XL) 10 MG 24 hr tablet    Sig: Take 1 tablet (10 mg total) by mouth daily with breakfast.    Dispense:  90 tablet    Refill:  1   SUMAtriptan  (IMITREX ) 50 MG tablet    Sig: Take 1 tablet (50 mg total) by mouth daily as needed for migraine. May repeat in 1-2 hours if headache persists or recurs.    Dispense:  10 tablet    Refill:  0    Follow-up: Return in about 3 months (around 07/15/2024).  Lenny Fiumara R Naomia Lenderman, FNP     [1]  Allergies Allergen Reactions   Other Itching and Rash    ALL NUTS   Peanut Allergen Powder-Dnfp Itching   "

## 2024-04-23 ENCOUNTER — Ambulatory Visit: Payer: Self-pay | Admitting: Nurse Practitioner

## 2024-06-27 ENCOUNTER — Ambulatory Visit: Admitting: Endocrinology

## 2024-07-15 ENCOUNTER — Ambulatory Visit: Payer: Self-pay | Admitting: Nurse Practitioner
